# Patient Record
Sex: Male | Born: 1970 | Race: White | Hispanic: No | Marital: Single | Smoking: Never smoker
Health system: Southern US, Community
[De-identification: ages and names within clinical notes are randomized; demographics above are authoritative.]

## PROBLEM LIST (undated history)

## (undated) DIAGNOSIS — I10 Essential (primary) hypertension: Secondary | ICD-10-CM

## (undated) HISTORY — PX: RETINAL DETACHMENT SURGERY: SHX105

## (undated) HISTORY — PX: EYE SURGERY: SHX253

---

## 2014-04-18 ENCOUNTER — Ambulatory Visit: Payer: Self-pay | Admitting: Ophthalmology

## 2014-05-27 NOTE — Op Note (Signed)
PATIENT NAME:  Isaac White, Isaac White MR#:  [id number] DATE OF BIRTH:  10/16/1970  DATE OF PROCEDURE:  04/18/2014  PREPROCEDURE DIAGNOSIS: Retinal detachment, right eye.  POSTPROCEDURE DIAGNOSIS: Retinal detachment, right eye.  PROCEDURE: A 25-gauge pars plana vitrectomy with drain retinotomy, air fluid exchange, endolaser and 24% SF6.   ANESTHESIA: Monitored anesthesia care with retrobulbar block.   COMPLICATIONS: None.   BLOOD LOSS: Minimal.   DESCRIPTION OF PROCEDURE: The patient was previously seen in the clinic for a retinal tear and a PVD in his right eye and laser barricade was performed. He presented to the clinic today before the operating room with a macula on retinal detachment starting adjacent to the previous laser. There was a small tear noted and the previous area of laser appeared to be flat. On the day of surgery, the patient was examined personally by me prior to taking back to the operating room holding area and I agreed with the previous evaluation. Risks, benefits, alternatives, and complications were reviewed with the patient. He had prior vitrectomy with scleral buckle in the other eye in the past. The right eye was then marked, consents were reviewed and the patient was taken to the operating room in supine position. Monitored anesthesia care was administered and a retrobulbar block was performed. The eye was then prepped and draped in the usual sterile fashion. Three 25-gauge trocars were placed in the usual positions. The infusion trocar was checked to make sure it was in the vitreous cavity prior to starting the infusion. A core vitrectomy was performed. The patient did indeed have a posterior vitreous detachment and so after the core vitrectomy was performed the vitreous was trimmed for 360 degrees to the periphery. Attention was drawn superiorly to the area of the retinal detachment and the vitreous was trimmed very short in this area. There was a very small break noted in close  proximity to the area of the previous laser. The vitreous was trimmed over both of these areas very closely. A 360 degree scleral depressed exam showed no additional tears noted. The break was marked. A drain retinotomy was made to drain the subretinal fluid. Fluid-fluid exchange was performed followed by the air-fluid exchange. Endolaser was then used to barricade the drain at the retinal tear. Prior to surgery, I discussed with the patient doing 360 degree laser which he was in favor of given his history of retinal detachment in the other eye and so 360 degree barricade laser was performed peripherally at the vitreous base. Four times the vitreous volume of 24% SF6 was infused into the eye. The trocars were then removed. The superior trocars were sutured to ensure that they were airtight. The pressure was acceptable by palpation. The subconjunctival cefuroxime and dexamethasone was placed and the patient was patched and shielded to the recovery area in stable condition.    ____________________________ Laban Emperor Oval Linsey, MD jdr:sb D: 04/19/2014 20:30:34 ET T: 04/20/2014 10:33:15 ET JOB#: [id number]  cc: Janett Billow D. Oval Linsey, MD, <Dictator> Alexia Freestone MD ELECTRONICALLY SIGNED 04/21/2014 11:39

## 2017-04-15 ENCOUNTER — Ambulatory Visit (INDEPENDENT_AMBULATORY_CARE_PROVIDER_SITE_OTHER): Payer: 59 | Admitting: Family Medicine

## 2017-04-15 ENCOUNTER — Encounter: Payer: Self-pay | Admitting: Family Medicine

## 2017-04-15 VITALS — BP 120/80 | HR 80 | Temp 98.7°F | Resp 16 | Ht 70.0 in | Wt 191.0 lb

## 2017-04-15 DIAGNOSIS — Z9229 Personal history of other drug therapy: Secondary | ICD-10-CM

## 2017-04-15 DIAGNOSIS — R6882 Decreased libido: Secondary | ICD-10-CM | POA: Diagnosis not present

## 2017-04-15 DIAGNOSIS — Z23 Encounter for immunization: Secondary | ICD-10-CM

## 2017-04-15 DIAGNOSIS — Z0000 Encounter for general adult medical examination without abnormal findings: Secondary | ICD-10-CM

## 2017-04-15 NOTE — Progress Notes (Addendum)
Patient: Isaac White, Male    DOB: [birth year], 47 y.o.   MRN: [id number] Visit Date: 04/15/2017  Today's Provider: Lelon Huh, MD   Chief Complaint  Patient presents with  . Annual Exam   Subjective:  He presents today to re-establish and for complete physical. He was last seen in this office 02/20/2013. He has had no significant health events since that time. Reviewed patient's entire medical, social, and family history today.    Annual physical exam [name] is a 47 y.o. male who presents today for health maintenance and complete physical. He feels well. He reports exercising yes-cardio and weights. He reports he is sleeping well. Exercises 4-5 days a week with trainer. Has been more fatigued and reports lowered libido for several months. No trouble with mood. 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   ----------------------------------------------------------------   Review of Systems  Constitutional: Positive for fatigue. Negative for chills, diaphoresis and fever.  HENT: Negative for congestion, ear discharge, ear pain, hearing loss, nosebleeds, sore throat and tinnitus.   Eyes: Negative for photophobia, pain, discharge and redness.  Respiratory: Negative for cough, shortness of breath, wheezing and stridor.   Cardiovascular: Negative for chest pain, palpitations and leg swelling.  Gastrointestinal: Negative for abdominal pain, blood in stool, constipation, diarrhea, nausea and vomiting.  Endocrine: Negative for polydipsia.  Genitourinary: Negative for dysuria, flank pain, frequency, hematuria and urgency.  Musculoskeletal: Negative for back pain, myalgias and neck pain.  Skin: Negative for rash.  Allergic/Immunologic: Negative for environmental allergies.  Neurological: Negative for dizziness, tremors, seizures, weakness and headaches.  Hematological: Does not bruise/bleed easily.  Psychiatric/Behavioral: Negative for hallucinations and suicidal ideas. The  patient is not nervous/anxious.     Social History      He  reports that he has never smoked. He has never used smokeless tobacco. He reports that he drinks alcohol. He reports that he does not use drugs.       Social History   Socioeconomic History  . Marital status: Single    Spouse name: Not on file  . Number of children: Not on file  . Years of education: Not on file  . Highest education level: Not on file  Occupational History  . Not on file  Social Needs  . Financial resource strain: Not on file  . Food insecurity:    Worry: Not on file    Inability: Not on file  . Transportation needs:    Medical: Not on file    Non-medical: Not on file  Tobacco Use  . Smoking status: Never Smoker  . Smokeless tobacco: Never Used  Substance and Sexual Activity  . Alcohol use: Yes  . Drug use: Never  . Sexual activity: Not on file  Lifestyle  . Physical activity:    Days per week: Not on file    Minutes per session: Not on file  . Stress: Not on file  Relationships  . Social connections:    Talks on phone: Not on file    Gets together: Not on file    Attends religious service: Not on file    Active member of club or organization: Not on file    Attends meetings of clubs or organizations: Not on file    Relationship status: Not on file  Other Topics Concern  . Not on file  Social History Narrative  . Not on file    History reviewed. No pertinent past medical history.  There are no active problems to display for this patient.   History reviewed. No pertinent surgical history.  Family History        Family Status  Relation Name Status  . Mother  Alive  . Father Egbert Garibaldi [place]  . Sister  Alive  . Brother  Alive        His family history includes Atrial fibrillation in his father; COPD in his father; Heart failure in his father; Hyperlipidemia in his father; Hypertension in his father; Kidney disease in his father.        No Known Allergies  No  current outpatient medications on file.   Patient Care Team: Birdie Sons, MD as PCP - General (Family Medicine)      Objective:   Vitals: BP 120/80 (BP Location: Right Arm, Patient Position: Sitting, Cuff Size: Large)   Pulse 80   Temp 98.7 F (37.1 C) (Oral)   Resp 16   Ht 5\' 10"  (1.778 m)   Wt 191 lb (86.6 kg)   SpO2 98%   BMI 27.41 kg/m    Vitals:   04/15/17 1013  BP: 120/80  Pulse: 80  Resp: 16  Temp: 98.7 F (37.1 C)  TempSrc: Oral  SpO2: 98%  Weight: 191 lb (86.6 kg)     Physical Exam   General Appearance:    Alert, cooperative, no distress, appears stated age  Head:    Normocephalic, without obvious abnormality, atraumatic  Eyes:    PERRL, conjunctiva/corneas clear, EOM's intact, fundi    benign, both eyes       Ears:    Normal TM's and external ear canals, both ears  Nose:   Nares normal, septum midline, mucosa normal, no drainage   or sinus tenderness  Throat:   Lips, mucosa, and tongue normal; teeth and gums normal  Neck:   Supple, symmetrical, trachea midline, no adenopathy;       thyroid:  No enlargement/tenderness/nodules; no carotid   bruit or JVD  Back:     Symmetric, no curvature, ROM normal, no CVA tenderness  Lungs:     Clear to auscultation bilaterally, respirations unlabored  Chest wall:    No tenderness or deformity  Heart:    Regular rate and rhythm, S1 and S2 normal, no murmur, rub   or gallop  Abdomen:     Soft, non-tender, bowel sounds active all four quadrants,    no masses, no organomegaly  Genitalia:    deferred  Rectal:    deferred  Extremities:   Extremities normal, atraumatic, no cyanosis or edema  Pulses:   2+ and symmetric all extremities  Skin:   Skin color, texture, turgor normal, no rashes or lesions  Lymph nodes:   Cervical, supraclavicular, and axillary nodes normal  Neurologic:   CNII-XII intact. Normal strength, sensation and reflexes      throughout    Depression Screen PHQ 2/9 Scores 04/15/2017  PHQ - 2  Score 0  PHQ- 9 Score 0      Assessment & Plan:     Routine Health Maintenance and Physical Exam  Exercise Activities and Dietary recommendations Goals    None       There is no immunization history on file for this patient.  Health Maintenance  Topic Date Due  . HIV Screening  06/30/1985  . TETANUS/TDAP  06/30/1989  . INFLUENZA VACCINE  08/26/2016     Discussed health benefits of physical activity, and encouraged him to engage in  regular exercise appropriate for his age and condition.    --------------------------------------------------------------------  1. Annual physical exam  - Comprehensive metabolic panel - HIV antibody - Lipid panel - CBC - Testosterone,Free and Total - PSA - DHEA - TSH - VITAMIN D 25 Hydroxy (Vit-D Deficiency, Fractures) - Cortisol  2. Decreased libido  - Comprehensive metabolic panel - HIV antibody - Lipid panel - CBC - Testosterone,Free and Total - PSA - DHEA - TSH - VITAMIN D 25 Hydroxy (Vit-D Deficiency, Fractures) - Cortisol  3. Need for prophylactic vaccination using tetanus and diphtheria toxoids adsorbed (Td) vaccine  - Td : Tetanus/diphtheria >7yo Preservative  free  4. Hepatitis A and hepatitis B vaccine administered  - Hepatitis A vaccine adult IM - Hepatitis B vaccine adult IM  Return in about 1 month (around 05/16/2017) for Hep B #2.     Lelon Huh, MD  [facility]

## 2017-04-16 ENCOUNTER — Encounter: Payer: Self-pay | Admitting: Family Medicine

## 2017-04-16 DIAGNOSIS — Z0000 Encounter for general adult medical examination without abnormal findings: Secondary | ICD-10-CM | POA: Diagnosis not present

## 2017-04-19 ENCOUNTER — Telehealth: Payer: Self-pay | Admitting: *Deleted

## 2017-04-19 ENCOUNTER — Encounter: Payer: Self-pay | Admitting: Family Medicine

## 2017-04-19 DIAGNOSIS — E291 Testicular hypofunction: Secondary | ICD-10-CM

## 2017-04-19 NOTE — Telephone Encounter (Signed)
Returning call.

## 2017-04-19 NOTE — Telephone Encounter (Signed)
Patient was notified of results. Expressed understanding.  

## 2017-04-19 NOTE — Telephone Encounter (Signed)
-----   Message from Birdie Sons, MD sent at 04/18/2017  7:10 PM EDT ----- All labs including PSA, blood sugar, kidney functions, electrolytes and cholesterol are all normal. Check labs yearly.

## 2017-04-19 NOTE — Telephone Encounter (Signed)
LMOVM for pt to return call 

## 2017-04-20 DIAGNOSIS — E291 Testicular hypofunction: Secondary | ICD-10-CM | POA: Diagnosis not present

## 2017-04-20 LAB — CBC
Hematocrit: 40.8 % (ref 37.5–51.0)
Hemoglobin: 13.8 g/dL (ref 13.0–17.7)
MCH: 31.4 pg (ref 26.6–33.0)
MCHC: 33.8 g/dL (ref 31.5–35.7)
MCV: 93 fL (ref 79–97)
Platelets: 159 10*3/uL (ref 150–379)
RBC: 4.4 x10E6/uL (ref 4.14–5.80)
RDW: 13 % (ref 12.3–15.4)
WBC: 5.2 10*3/uL (ref 3.4–10.8)

## 2017-04-20 LAB — COMPREHENSIVE METABOLIC PANEL
ALT: 26 [IU]/L (ref 0–44)
AST: 23 [IU]/L (ref 0–40)
Albumin/Globulin Ratio: 2.4 — ABNORMAL HIGH (ref 1.2–2.2)
Albumin: 4.8 g/dL (ref 3.5–5.5)
Alkaline Phosphatase: 61 [IU]/L (ref 39–117)
BUN/Creatinine Ratio: 19 (ref 9–20)
BUN: 22 mg/dL (ref 6–24)
Bilirubin Total: 0.4 mg/dL (ref 0.0–1.2)
CO2: 24 mmol/L (ref 20–29)
Calcium: 9.5 mg/dL (ref 8.7–10.2)
Chloride: 102 mmol/L (ref 96–106)
Creatinine, Ser: 1.18 mg/dL (ref 0.76–1.27)
GFR calc Af Amer: 85 mL/min/{1.73_m2} (ref >59)
GFR calc non Af Amer: 74 mL/min/{1.73_m2} (ref >59)
Globulin, Total: 2 g/dL (ref 1.5–4.5)
Glucose: 88 mg/dL (ref 65–99)
Potassium: 4.6 mmol/L (ref 3.5–5.2)
Sodium: 142 mmol/L (ref 134–144)
Total Protein: 6.8 g/dL (ref 6.0–8.5)

## 2017-04-20 LAB — CORTISOL: Cortisol: 11.8 ug/dL

## 2017-04-20 LAB — VITAMIN D 25 HYDROXY (VIT D DEFICIENCY, FRACTURES): Vit D, 25-Hydroxy: 33.2 ng/mL (ref 30.0–100.0)

## 2017-04-20 LAB — TESTOSTERONE,FREE AND TOTAL
Testosterone, Free: 7.9 pg/mL (ref 6.8–21.5)
Testosterone: 268 ng/dL (ref 264–916)

## 2017-04-20 LAB — HIV ANTIBODY (ROUTINE TESTING W REFLEX): HIV Screen 4th Generation wRfx: NONREACTIVE

## 2017-04-20 LAB — LIPID PANEL
Chol/HDL Ratio: 3.3 {ratio} (ref 0.0–5.0)
Cholesterol, Total: 185 mg/dL (ref 100–199)
HDL: 56 mg/dL (ref >39)
LDL Calculated: 118 mg/dL — ABNORMAL HIGH (ref 0–99)
Triglycerides: 56 mg/dL (ref 0–149)
VLDL Cholesterol Cal: 11 mg/dL (ref 5–40)

## 2017-04-20 LAB — PSA: Prostate Specific Ag, Serum: 0.7 ng/mL (ref 0.0–4.0)

## 2017-04-20 LAB — DHEA: Dehydroepiandrosterone: 229 ng/dL (ref 31–701)

## 2017-04-20 LAB — TSH: TSH: 4.06 u[IU]/mL (ref 0.450–4.500)

## 2017-04-21 LAB — TESTOSTERONE,FREE AND TOTAL
Testosterone, Free: 6.1 pg/mL — ABNORMAL LOW (ref 6.8–21.5)
Testosterone: 314 ng/dL (ref 264–916)

## 2017-04-21 LAB — PROLACTIN: Prolactin: 3.3 ng/mL — ABNORMAL LOW (ref 4.0–15.2)

## 2017-04-21 LAB — FOLLICLE STIMULATING HORMONE: FSH: 5.5 m[IU]/mL (ref 1.5–12.4)

## 2017-04-22 ENCOUNTER — Telehealth: Payer: Self-pay | Admitting: Family Medicine

## 2017-04-22 MED ORDER — TESTOSTERONE 20.25 MG/ACT (1.62%) TD GEL: 2.0000 | Freq: Every day | TRANSDERMAL | 1 refills | Status: DC

## 2017-04-22 NOTE — Telephone Encounter (Signed)
This patient wanted to start testosterone,but I don't have any pharmacy information. I can't tell if he got my message asking which pharmacy he wants to use. Can you please call and find out. Thanks.

## 2017-04-23 NOTE — Telephone Encounter (Signed)
Patient uses [facility].

## 2017-04-27 ENCOUNTER — Other Ambulatory Visit: Payer: Self-pay | Admitting: Family Medicine

## 2017-04-28 MED ORDER — TESTOSTERONE CYPIONATE 100 MG/ML IM SOLN: 100.0000 mg | INTRAMUSCULAR | 0 refills | Status: DC

## 2017-04-28 NOTE — Telephone Encounter (Signed)
Pt called back regarding his Rx testocerone gel.  He said he sent a message in [place] with several options for insurance to cover.  He has not heard anything back  His call back is [phone].  Thanks Con Memos

## 2017-04-28 NOTE — Telephone Encounter (Signed)
Please advise patient have sent prescription for testosterone injection to [facility]. He needs to bring medications to office for instructions on doing self injections. He needs to scheduled follow up o.v. In 8 weeks.   Also, please check with pharmacy to see if they have needles and syringes for IM injection. Should only need 1 ml syringes, can call in prescription for 25 of each.

## 2017-04-29 NOTE — Telephone Encounter (Signed)
Patient was advised. Per pharmacy they do have syringes and needles. Verbal rx given over the phone for 25 of each.

## 2017-05-17 ENCOUNTER — Ambulatory Visit (INDEPENDENT_AMBULATORY_CARE_PROVIDER_SITE_OTHER): Payer: 59 | Admitting: Family Medicine

## 2017-05-17 VITALS — Temp 98.3°F

## 2017-05-17 DIAGNOSIS — Z23 Encounter for immunization: Secondary | ICD-10-CM

## 2017-05-17 NOTE — Progress Notes (Signed)
After obtaining informed consent, the second Hepatitis B immunization is given by Elizabeth Palau, CMA(AAMA).  1. Need for hepatitis A and B vaccination Should return for Hep B #3 and Hep A #2 around 10-15-17. - Hepatitis B vaccine pediatric / adolescent 3-dose IM

## 2017-07-05 ENCOUNTER — Encounter: Payer: Self-pay | Admitting: Family Medicine

## 2017-07-05 ENCOUNTER — Ambulatory Visit: Payer: 59 | Admitting: Family Medicine

## 2017-07-05 VITALS — BP 128/80 | HR 70 | Temp 98.6°F | Resp 16 | Wt 190.0 lb

## 2017-07-05 DIAGNOSIS — E291 Testicular hypofunction: Secondary | ICD-10-CM | POA: Diagnosis not present

## 2017-07-05 NOTE — Progress Notes (Signed)
       Patient: Isaac White Male    DOB: 09-19-1970   47 y.o.   MRN: [id number] Visit Date: 07/05/2017  Today's Provider: Lelon Huh, MD   Chief Complaint  Patient presents with  . Follow-up   Subjective:    HPI  Low testosterone  From 04/15/2017-labs checked.  Lab Results  Component Value Date   TESTOSTERONE 314 04/20/2017   Since last ov on 04/29/2017 started testosterone injections at 40ml x 100mg /ml every two weeks. . Patient reports good compliance with treatment and good tolerance. He has noticed a little bit of improvement in energy level, exercise stamina, and libido, but not as much as he expected. His mood has not changed. Energy level is consistently between injections.    Wt Readings from Last 3 Encounters:  07/05/17 190 lb (86.2 kg)  04/15/17 191 lb (86.6 kg)       No Known Allergies   Current Outpatient Medications:  .  testosterone cypionate (DEPOTESTOTERONE CYPIONATE) 100 MG/ML injection, Inject 1 mL (100 mg total) into the muscle every 14 (fourteen) days. For IM use only, Disp: 10 mL, Rfl: 0  Review of Systems  Constitutional: Negative for appetite change, chills and fever.  Respiratory: Negative for chest tightness, shortness of breath and wheezing.   Cardiovascular: Negative for chest pain and palpitations.  Gastrointestinal: Negative for abdominal pain, nausea and vomiting.    Social History   Tobacco Use  . Smoking status: Never Smoker  . Smokeless tobacco: Never Used  Substance Use Topics  . Alcohol use: Yes    Comment: occasionally   Objective:   BP 128/80 (BP Location: Left Arm, Patient Position: Sitting, Cuff Size: Large)   Pulse 70   Temp 98.6 F (37 C) (Oral)   Resp 16   Wt 190 lb (86.2 kg)   SpO2 98% Comment: room air  BMI 27.26 kg/m  Vitals:   07/05/17 1345  BP: 128/80  Pulse: 70  Resp: 16  Temp: 98.6 F (37 C)  TempSrc: Oral  SpO2: 98%  Weight: 190 lb (86.2 kg)     Physical Exam  General appearance: alert,  well developed, well nourished, cooperative and in no distress Head: Normocephalic, without obvious abnormality, atraumatic Respiratory: Respirations even and unlabored, normal respiratory rate Extremities: No gross deformities .     Assessment & Plan:     1. Hypogonadism male Doing well since initiation of testosterone injection two weeks ago.  - Testosterone,Free and Total       Lelon Huh, MD  [facility]

## 2017-07-06 DIAGNOSIS — E291 Testicular hypofunction: Secondary | ICD-10-CM | POA: Diagnosis not present

## 2017-07-07 ENCOUNTER — Other Ambulatory Visit: Payer: Self-pay | Admitting: Family Medicine

## 2017-07-07 ENCOUNTER — Telehealth: Payer: Self-pay | Admitting: *Deleted

## 2017-07-07 LAB — TESTOSTERONE,FREE AND TOTAL
Testosterone, Free: 4.3 pg/mL — ABNORMAL LOW (ref 6.8–21.5)
Testosterone: 108 ng/dL — ABNORMAL LOW (ref 264–916)

## 2017-07-07 MED ORDER — TESTOSTERONE CYPIONATE 100 MG/ML IM SOLN: 200.0000 mg | INTRAMUSCULAR | 0 refills | Status: DC

## 2017-07-07 NOTE — Telephone Encounter (Signed)
LMOVM for pt to return call 

## 2017-07-07 NOTE — Telephone Encounter (Signed)
-----   Message from Birdie Sons, MD sent at 07/07/2017  7:52 AM EDT ----- Testosterone levels still low, need to increase to 2 ml every two weeks. Have sent new prescription to pharmacy. Need to recheck levels in 1 month.

## 2017-07-07 NOTE — Telephone Encounter (Signed)
Patient advised. He will call back in 1 month to request a lab slip to have testosterone rechecked.

## 2017-08-13 ENCOUNTER — Telehealth: Payer: Self-pay | Admitting: Family Medicine

## 2017-08-13 DIAGNOSIS — E291 Testicular hypofunction: Secondary | ICD-10-CM

## 2017-08-13 NOTE — Telephone Encounter (Signed)
Please check with patient and see how he is doing with increased dose of testosterone, if tolerating then need to get levels rechecked sometimes in the next week or so. Can print pended order and leave at lab for him. Needs to be drawn as close to 8am as possible.

## 2017-08-13 NOTE — Telephone Encounter (Signed)
LMOVM for pt to return call 

## 2017-08-13 NOTE — Telephone Encounter (Signed)
-----   Message from Birdie Sons, MD sent at 07/07/2017  7:55 AM EDT ----- Regarding: FW: check testosterone levels first week of July   ----- Message ----- From: Birdie Sons, MD Sent: 07/07/2017   7:54 AM To: Birdie Sons, MD Subject: check testosterone levels first week of July

## 2017-08-16 NOTE — Telephone Encounter (Signed)
Patient states he is tolerating increased dose well. He will come in one morning this week to get labs done. Lab slip printed.

## 2017-08-18 LAB — CBC
Hematocrit: 40.5 % (ref 37.5–51.0)
Hemoglobin: 13.7 g/dL (ref 13.0–17.7)
MCH: 30.8 pg (ref 26.6–33.0)
MCHC: 33.8 g/dL (ref 31.5–35.7)
MCV: 91 fL (ref 79–97)
Platelets: 174 10*3/uL (ref 150–450)
RBC: 4.45 x10E6/uL (ref 4.14–5.80)
RDW: 13.4 % (ref 12.3–15.4)
WBC: 3.9 10*3/uL (ref 3.4–10.8)

## 2017-08-18 LAB — TESTOSTERONE,FREE AND TOTAL
Testosterone, Free: 5.6 pg/mL — ABNORMAL LOW (ref 6.8–21.5)
Testosterone: 171 ng/dL — ABNORMAL LOW (ref 264–916)

## 2017-10-06 ENCOUNTER — Other Ambulatory Visit: Payer: Self-pay | Admitting: Family Medicine

## 2017-10-06 MED ORDER — TESTOSTERONE CYPIONATE 100 MG/ML IM SOLN: 200.0000 mg | INTRAMUSCULAR | 2 refills | Status: DC

## 2017-10-08 ENCOUNTER — Telehealth: Payer: Self-pay | Admitting: Family Medicine

## 2017-10-08 NOTE — Telephone Encounter (Signed)
Advised patient as below.  

## 2017-10-08 NOTE — Telephone Encounter (Signed)
Pt called saying he called [facility] and they have not received an order for his Testosterone 100 mg.  Pt's CB#  [phone]  Thanks  teri

## 2017-10-25 ENCOUNTER — Encounter: Payer: Self-pay | Admitting: Family Medicine

## 2017-10-25 ENCOUNTER — Ambulatory Visit: Payer: 59 | Admitting: Family Medicine

## 2017-10-25 VITALS — BP 143/86 | HR 70 | Temp 98.9°F | Resp 16 | Wt 196.0 lb

## 2017-10-25 DIAGNOSIS — Z23 Encounter for immunization: Secondary | ICD-10-CM | POA: Diagnosis not present

## 2017-10-25 DIAGNOSIS — D1800 Hemangioma unspecified site: Secondary | ICD-10-CM | POA: Diagnosis not present

## 2017-10-25 DIAGNOSIS — E291 Testicular hypofunction: Secondary | ICD-10-CM | POA: Diagnosis not present

## 2017-10-25 DIAGNOSIS — R221 Localized swelling, mass and lump, neck: Secondary | ICD-10-CM

## 2017-10-25 NOTE — Progress Notes (Signed)
       Patient: Isaac White Male    DOB: Nov 10, 1970   47 y.o.   MRN: [id number] Visit Date: 10/25/2017  Today's Provider: Lelon Huh, MD   Chief Complaint  Patient presents with  . Mass   Subjective:    HPI Neck mass: Patient comes in for an evaluation of a small mass on the right side of this neck that appeared 2 months ago. Patient denies any pain or change in size.   Skin lesion: Patient comes in for an evaluation of a skin lesion on the bottom of his left foot. Patient states the lesion appeared 2 months ago and hans'nt changed in size.   Is also here to follow up on hypogonadism. Testosterone dose was double in July Lab Results  Component Value Date   TESTOSTERONE 171 (L) 08/17/2017   He states he has had considerable improvement in energy levels, less aching in his muscles and improvement in general well being.      No Known Allergies   Current Outpatient Medications:  .  testosterone cypionate (DEPOTESTOTERONE CYPIONATE) 100 MG/ML injection, Inject 2 mLs (200 mg total) into the muscle every 14 (fourteen) days. For IM use only, Disp: 10 mL, Rfl: 2  Review of Systems  Constitutional: Negative for appetite change, chills and fever.  Respiratory: Negative for chest tightness, shortness of breath and wheezing.   Cardiovascular: Negative for chest pain and palpitations.  Gastrointestinal: Negative for abdominal pain, nausea and vomiting.  Skin:       Skin lesion and neck mass    Social History   Tobacco Use  . Smoking status: Never Smoker  . Smokeless tobacco: Never Used  Substance Use Topics  . Alcohol use: Yes    Comment: occasionally   Objective:   BP (!) 143/86 (BP Location: Left Arm, Cuff Size: Large)   Pulse 70   Temp 98.9 F (37.2 C) (Oral)   Resp 16   Wt 196 lb (88.9 kg)   SpO2 98% Comment: room air  BMI 28.12 kg/m  Vitals:   10/25/17 1040 10/25/17 1043  BP: (!) 142/84 (!) 143/86  Pulse: 70   Resp: 16   Temp: 98.9 F (37.2 C)     TempSrc: Oral   SpO2: 98%   Weight: 196 lb (88.9 kg)      Physical Exam  General appearance: alert, well developed, well nourished, cooperative and in no distress Head: Normocephalic, without obvious abnormality, atraumatic Neck : subtle small, pea sized mass, non tender right anterolateral neck.  Ext: about 63mm well circumscribed reddish, non tender, raised lesion dorsum of left foot consistent with hemangioma.      Assessment & Plan:     1. Mass of right side of neck  - US Soft Tissue Head/Neck; Future  2. Hypogonadism male Subjectively improved on supplemental testosterone. Check levels today, he would like to change formulation to 2mg /ml with next refill.  - Testosterone,Free and Total - CBC  3. Need for hepatitis B vaccination  - Hepatitis B vaccine adult IM  4. Need for hepatitis A vaccination  - Hepatitis A vaccine adult IM  5. Need for influenza vaccination  - Flu Vaccine QUAD 6+ mos PF IM (Fluarix Quad PF)  6. Hemangioma (suspected) Offered podiatry referral. He is going to given lesion some more time to see if it will resolve      Lelon Huh, MD  [place]

## 2017-10-26 DIAGNOSIS — E291 Testicular hypofunction: Secondary | ICD-10-CM | POA: Diagnosis not present

## 2017-10-28 ENCOUNTER — Other Ambulatory Visit: Payer: Self-pay | Admitting: Family Medicine

## 2017-10-28 LAB — CBC
Hematocrit: 46.8 % (ref 37.5–51.0)
Hemoglobin: 15.3 g/dL (ref 13.0–17.7)
MCH: 30.5 pg (ref 26.6–33.0)
MCHC: 32.7 g/dL (ref 31.5–35.7)
MCV: 93 fL (ref 79–97)
Platelets: 166 10*3/uL (ref 150–450)
RBC: 5.01 x10E6/uL (ref 4.14–5.80)
RDW: 13.2 % (ref 12.3–15.4)
WBC: 5.6 10*3/uL (ref 3.4–10.8)

## 2017-10-28 LAB — TESTOSTERONE,FREE AND TOTAL
Testosterone, Free: 27.9 pg/mL — ABNORMAL HIGH (ref 6.8–21.5)
Testosterone: 1293 ng/dL — ABNORMAL HIGH (ref 264–916)

## 2017-10-28 MED ORDER — TESTOSTERONE CYPIONATE 200 MG/ML IJ SOLN: 1.0000 mL | INTRAMUSCULAR | 5 refills | Status: DC

## 2017-11-01 ENCOUNTER — Ambulatory Visit
Admission: RE | Admit: 2017-11-01 | Discharge: 2017-11-01 | Disposition: A | Discharge status: Home / Self Care | Payer: 59 | Source: Ambulatory Visit | Attending: Family Medicine | Admitting: Family Medicine

## 2017-11-01 DIAGNOSIS — R599 Enlarged lymph nodes, unspecified: Secondary | ICD-10-CM | POA: Diagnosis not present

## 2017-11-01 DIAGNOSIS — R221 Localized swelling, mass and lump, neck: Secondary | ICD-10-CM

## 2018-01-20 ENCOUNTER — Telehealth: Payer: Self-pay | Admitting: Family Medicine

## 2018-01-20 DIAGNOSIS — E291 Testicular hypofunction: Secondary | ICD-10-CM

## 2018-01-20 NOTE — Telephone Encounter (Signed)
-----   Message from Birdie Sons, MD sent at 01/18/2018  9:57 AM EST ----- Regarding: FW: recheck testosterone before end of year.   ----- Message ----- From: Birdie Sons, MD Sent: 01/13/2018 To: Birdie Sons, MD Subject: recheck testosterone before end of year.

## 2018-01-20 NOTE — Telephone Encounter (Signed)
Please advise patient is time to recheck testosterone levels since adjusting dose in the fall. Please print and leave order at lab for him. Thanks.

## 2018-01-20 NOTE — Telephone Encounter (Signed)
Tried calling; pt's mailbox is full.   Thanks,   -Laura  

## 2018-01-24 NOTE — Telephone Encounter (Signed)
Patient advised as below.  

## 2018-01-27 DIAGNOSIS — E291 Testicular hypofunction: Secondary | ICD-10-CM | POA: Diagnosis not present

## 2018-01-29 LAB — CBC
Hematocrit: 43.5 % (ref 37.5–51.0)
Hemoglobin: 14.9 g/dL (ref 13.0–17.7)
MCH: 30.9 pg (ref 26.6–33.0)
MCHC: 34.3 g/dL (ref 31.5–35.7)
MCV: 90 fL (ref 79–97)
Platelets: 189 10*3/uL (ref 150–450)
RBC: 4.82 x10E6/uL (ref 4.14–5.80)
RDW: 13 % (ref 12.3–15.4)
WBC: 5.6 10*3/uL (ref 3.4–10.8)

## 2018-01-29 LAB — TESTOSTERONE,FREE AND TOTAL
Testosterone, Free: 8.4 pg/mL (ref 6.8–21.5)
Testosterone: 234 ng/dL — ABNORMAL LOW (ref 264–916)

## 2018-03-08 ENCOUNTER — Other Ambulatory Visit: Payer: Self-pay | Admitting: Family Medicine

## 2018-03-08 MED ORDER — TESTOSTERONE CYPIONATE 200 MG/ML IJ SOLN: 1.0000 mL | INTRAMUSCULAR | 5 refills | Status: DC

## 2018-03-08 NOTE — Telephone Encounter (Signed)
[facility] faxed refill request for the following medications:  Testoster CYP VIAL  Please advise. Thanks TNP

## 2018-03-09 ENCOUNTER — Encounter: Payer: Self-pay | Admitting: Family Medicine

## 2018-03-14 ENCOUNTER — Encounter: Payer: Self-pay | Admitting: Family Medicine

## 2018-03-14 ENCOUNTER — Telehealth: Payer: Self-pay | Admitting: Family Medicine

## 2018-03-14 NOTE — Telephone Encounter (Signed)
Prescription was sent to [facility] on 03/08/2018. Please call [facility] and find out what the problem is.   Thanks.   ----- Message -----  From: Marigene Ehlers  Sent: 03/14/2018  8:26 AM EST  To: [facility]  Subject: Non-Urgent Medical Question             Hi Dr Caryn Section,    I sent you an email last week about my prescription that I'm out of testosterone and need a refill. My pharmacy [facility] recently closed. I ordered the refill through [facility] and they called Friday saying that they reached out but haven't heard anything. Can you follow up to approve the refill?    Thank you,  Isaac White

## 2018-03-15 NOTE — Telephone Encounter (Signed)
Pt with a representative from [facility].  They just need the Pt to call and sign some paper work before they can ship out his first RX.  I advised the pt and gave him the number to call [phone].  Thanks,   -Mickel Baas

## 2018-08-09 ENCOUNTER — Encounter: Payer: Self-pay | Admitting: Family Medicine

## 2018-08-12 ENCOUNTER — Encounter: Payer: Self-pay | Admitting: Family Medicine

## 2018-08-12 MED ORDER — TESTOSTERONE CYPIONATE 200 MG/ML IJ SOLN: 1.0000 mL | INTRAMUSCULAR | 1 refills | Status: DC

## 2018-12-27 ENCOUNTER — Encounter: Payer: Self-pay | Admitting: Family Medicine

## 2018-12-27 DIAGNOSIS — E291 Testicular hypofunction: Secondary | ICD-10-CM | POA: Insufficient documentation

## 2018-12-28 ENCOUNTER — Other Ambulatory Visit: Payer: Self-pay | Admitting: Family Medicine

## 2018-12-29 NOTE — Telephone Encounter (Signed)
Please review. Dr. Caryn Section is out of the office until 01/02/2019.

## 2019-02-14 ENCOUNTER — Encounter: Payer: Self-pay | Admitting: Family Medicine

## 2019-03-07 ENCOUNTER — Other Ambulatory Visit: Payer: Self-pay | Admitting: Family Medicine

## 2019-04-07 ENCOUNTER — Ambulatory Visit: Payer: Self-pay | Attending: Internal Medicine

## 2019-04-07 DIAGNOSIS — Z23 Encounter for immunization: Secondary | ICD-10-CM

## 2019-04-07 NOTE — Progress Notes (Signed)
   Covid-19 Vaccination Clinic  Name:  Isaac White    MRN: [id number] DOB: 1970/08/30  04/07/2019  [name] was observed post Covid-19 immunization for 15 minutes without incident. He was provided with Vaccine Information Sheet and instruction to access the V-Safe system.   [name] was instructed to call 911 with any severe reactions post vaccine: Marland Kitchen Difficulty breathing  . Swelling of face and throat  . A fast heartbeat  . A bad rash all over body  . Dizziness and weakness   Immunizations Administered    Name Date Dose VIS Date Route   Pfizer COVID-19 Vaccine 04/07/2019 10:36 AM 0.3 mL 01/06/2019 Intramuscular   Manufacturer: [place]   Lot: [id number]   [place]: [id number]

## 2019-05-01 ENCOUNTER — Ambulatory Visit: Payer: Self-pay | Attending: Internal Medicine

## 2019-05-01 DIAGNOSIS — Z23 Encounter for immunization: Secondary | ICD-10-CM

## 2019-05-01 NOTE — Progress Notes (Signed)
   Covid-19 Vaccination Clinic  Name:  Isaac White    MRN: [id number] DOB: Mar 28, 1970  05/01/2019  Mr. Silerio was observed post Covid-19 immunization for 15 minutes without incident. He was provided with Vaccine Information Sheet and instruction to access the V-Safe system.   Mr. Noriega was instructed to call 911 with any severe reactions post vaccine: Marland Kitchen Difficulty breathing  . Swelling of face and throat  . A fast heartbeat  . A bad rash all over body  . Dizziness and weakness   Immunizations Administered    Name Date Dose VIS Date Route   Pfizer COVID-19 Vaccine 05/01/2019  2:16 PM 0.3 mL 01/06/2019 Intramuscular   Manufacturer: [place]   Lot: [id number]   [place]: [id number]

## 2019-06-08 ENCOUNTER — Encounter: Payer: Self-pay | Admitting: Family Medicine

## 2019-06-08 MED ORDER — TESTOSTERONE CYPIONATE 200 MG/ML IM SOLN: INTRAMUSCULAR | 5 refills | Status: DC

## 2019-08-14 ENCOUNTER — Encounter: Payer: Self-pay | Admitting: Family Medicine

## 2019-08-15 MED ORDER — TESTOSTERONE CYPIONATE 200 MG/ML IM SOLN: INTRAMUSCULAR | 5 refills | Status: DC

## 2019-08-19 IMAGING — US US SOFT TISSUE HEAD/NECK
1 series · 8 of 8 positions shown · non-contrast
Comparison: None.

CLINICAL DATA: 47-year-old male with a history of palpable neck
lump

EXAM:
ULTRASOUND OF HEAD/NECK SOFT TISSUES
TECHNIQUE: Ultrasound examination of the head and neck soft tissues was
performed in the area of clinical concern.

[Series 1: us soft tissue head/neck · 0.07mm/px · 8 of 8 slices shown]
[im 1/8]
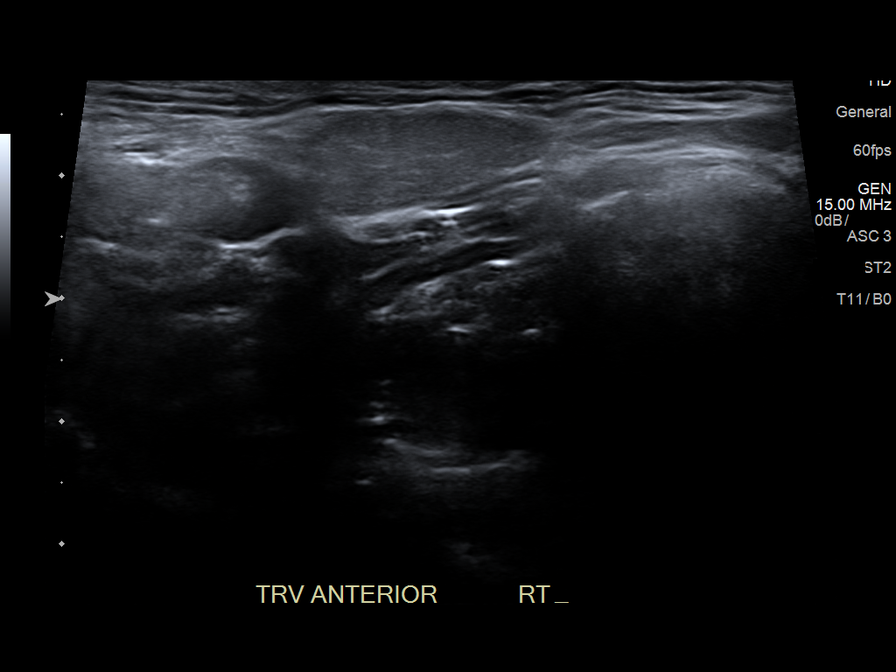
[im 2/8]
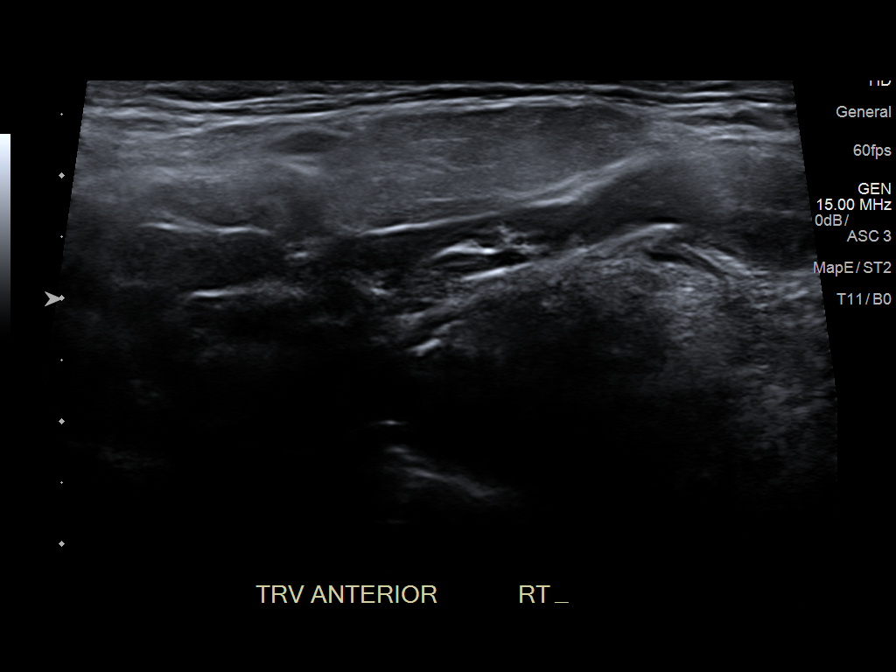
[im 3/8]
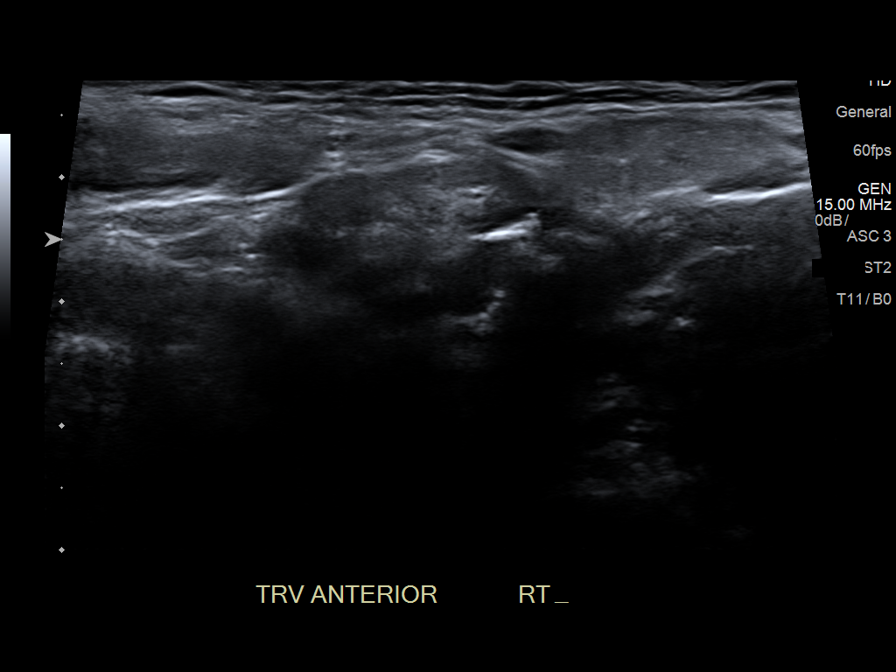
[im 4/8]
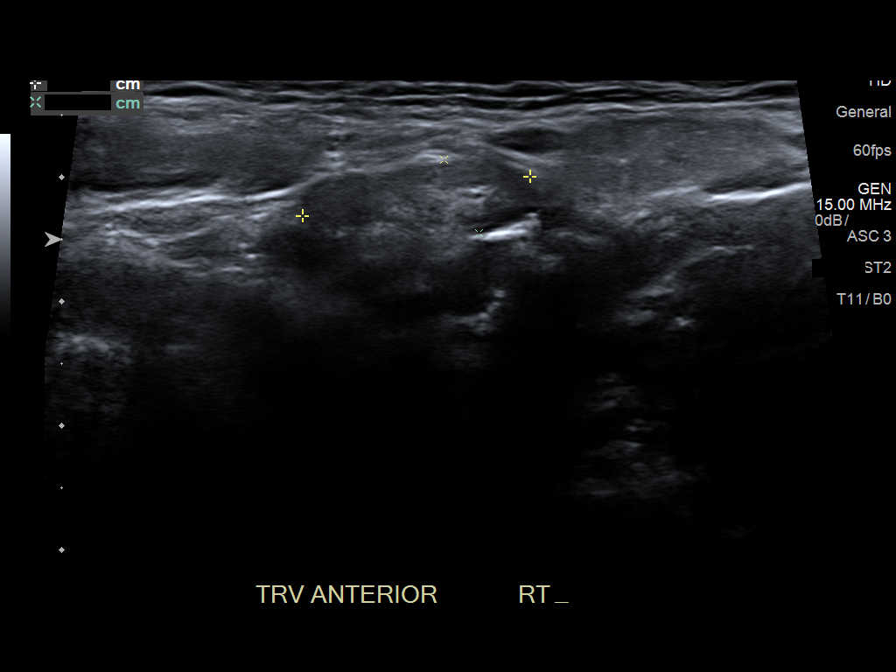
[im 5/8]
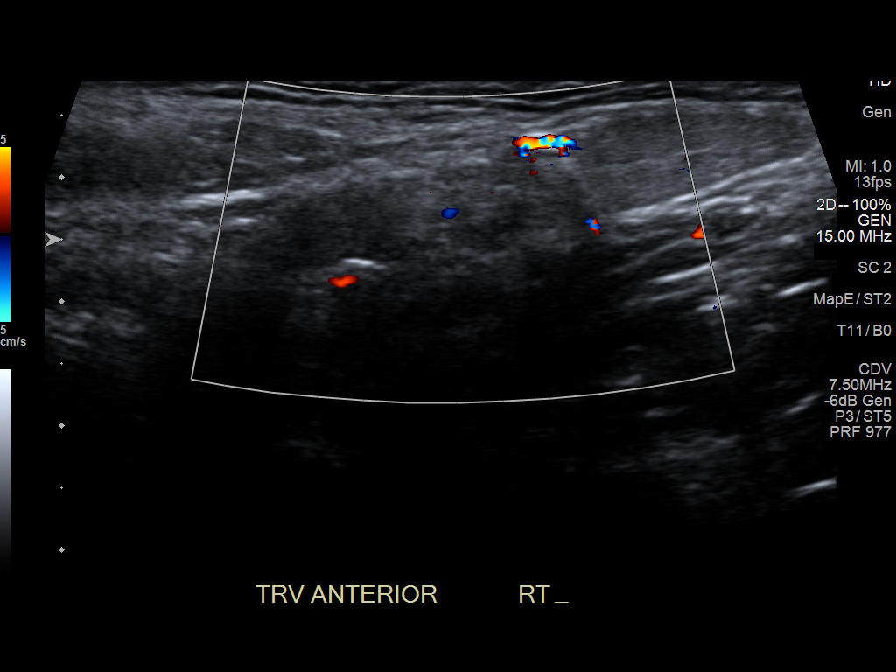
[im 6/8]
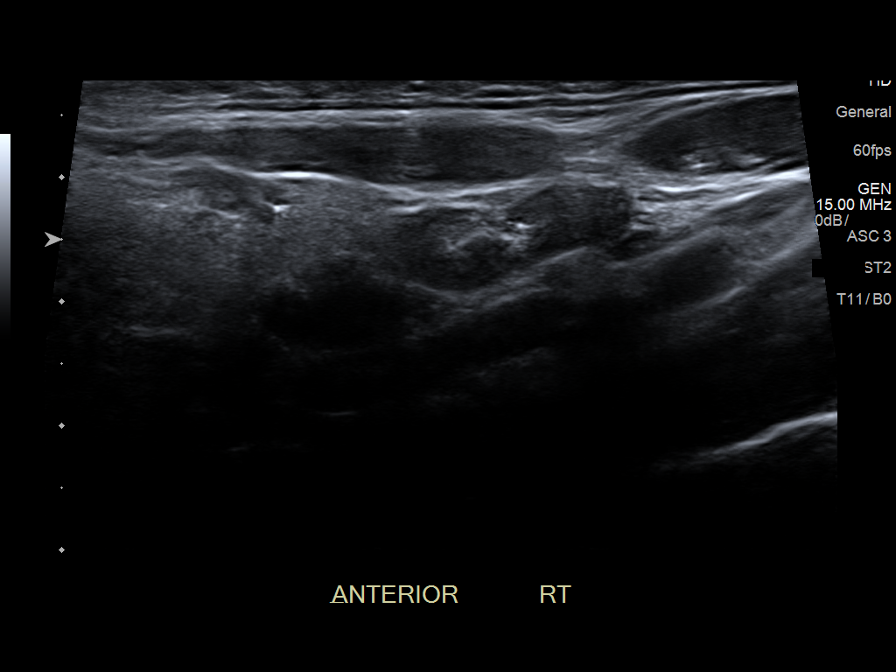
[im 7/8]
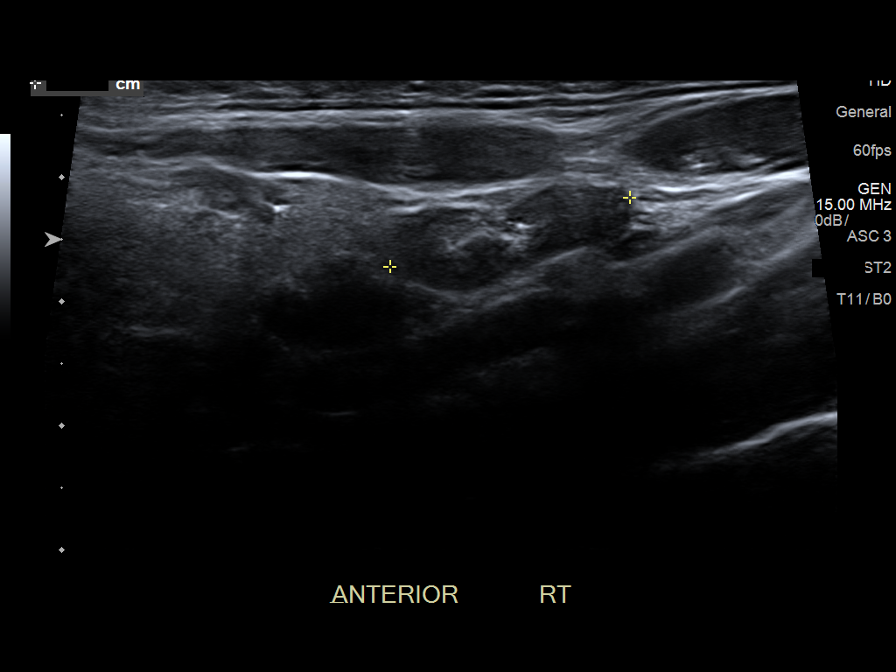
[im 8/8]
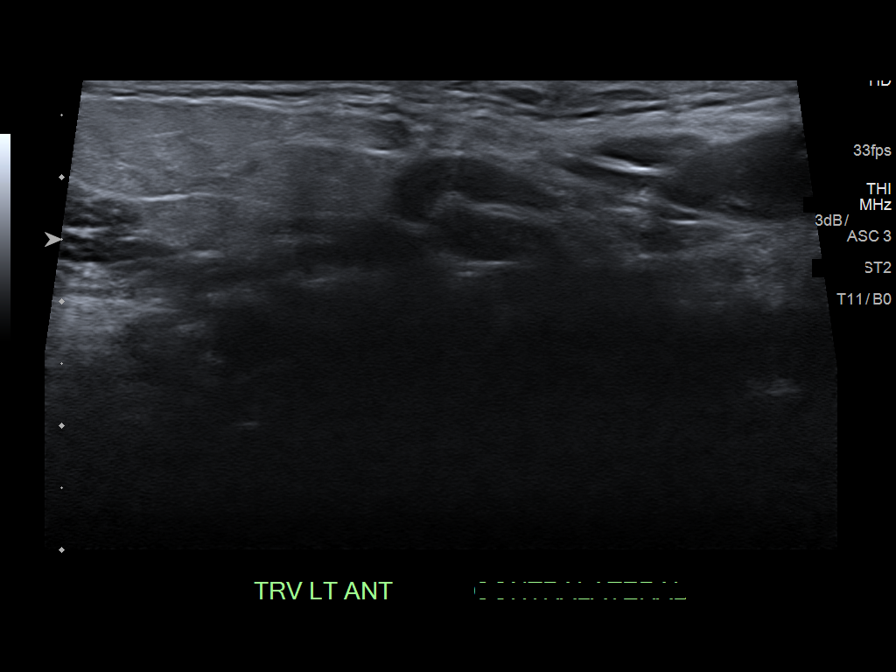

[8 of 8 positions shown; findings below may reference images not displayed]

FINDINGS: Grayscale and color duplex ultrasound performed in the region
clinical concern.

No focal fluid.

Lymph nodes identified in the region of clinical concern with short
axis dimension measuring 7 mm and maintained typical architecture.
IMPRESSION: Typical appearing lymph nodes in the region of clinical concern,
potentially reactive, though nonspecific by ultrasound.

## 2019-08-30 DIAGNOSIS — Z03818 Encounter for observation for suspected exposure to other biological agents ruled out: Secondary | ICD-10-CM | POA: Diagnosis not present

## 2019-08-30 DIAGNOSIS — Z20822 Contact with and (suspected) exposure to covid-19: Secondary | ICD-10-CM | POA: Diagnosis not present

## 2019-08-30 DIAGNOSIS — Z1152 Encounter for screening for COVID-19: Secondary | ICD-10-CM | POA: Diagnosis not present

## 2019-08-30 DIAGNOSIS — R519 Headache, unspecified: Secondary | ICD-10-CM | POA: Diagnosis not present

## 2019-09-04 NOTE — Progress Notes (Signed)
Complete physical exam   Patient: Isaac White   DOB: [birth year]   49 y.o. Male  MRN: [id number] Visit Date: 09/06/2019  Today's healthcare provider: Trinna Post, PA-C   Chief Complaint  Patient presents with  . Annual Exam   Subjective    Isaac White is a 49 y.o. male who presents today for a complete physical exam.  He reports consuming a general diet. The patient does not participate in regular exercise at present. He generally feels well. He reports sleeping well. He does have additional problems to discuss today.   Hypogonadism  He continues on testosterone which is prescribed by his PCP. He would like to check his testosterone checked.   Right Neck Mass  Patient also mentions that he has a knot on the right side of his neck X 1 month. It is not getting bigger. It is not painful. It is not producing any pus or blood. He is not having fevers, chills, weight loss. He had a right sided neck mass a couple of years ago which he reports was different feeling from this. He had an ultrasound of his neck at that time which was normal.   Elevated Blood Pressure  Hypertension, follow-up  BP Readings from Last 3 Encounters:  09/06/19 128/82  10/25/17 (!) 143/86  07/05/17 128/80   Wt Readings from Last 3 Encounters:  09/06/19 204 lb (92.5 kg)  10/25/17 196 lb (88.9 kg)  07/05/17 190 lb (86.2 kg)     He was last seen for hypertension 1 years ago.  BP at that visit was elevaed. Management since that visit includes diet controlled, no medications started.   Use of agents associated with hypertension: none.   Outside blood pressures are Normal. Symptoms: No chest pain No chest pressure  No palpitations No syncope  No dyspnea No orthopnea  No paroxysmal nocturnal dyspnea No lower extremity edema   Pertinent labs: Lab Results  Component Value Date   CHOL 185 04/16/2017   HDL 56 04/16/2017   LDLCALC 118 (H) 04/16/2017   TRIG 56 04/16/2017   CHOLHDL 3.3  04/16/2017   Lab Results  Component Value Date   NA 142 04/16/2017   K 4.6 04/16/2017   CREATININE 1.18 04/16/2017   GFRNONAA 74 04/16/2017   GFRAA 85 04/16/2017   GLUCOSE 88 04/16/2017     The 10-year ASCVD risk score Mikey Bussing DC Jr., et al., 2013) is: 2.5%   ---------------------------------------------------------------------------------------------------  BP Readings from Last 7 Encounters:  09/06/19 128/82  10/25/17 (!) 143/86  07/05/17 128/80  04/15/17 120/80     No past medical history on file. No past surgical history on file. Social History   Socioeconomic History  . Marital status: Single    Spouse name: Not on file  . Number of children: Not on file  . Years of education: Not on file  . Highest education level: Not on file  Occupational History  . Not on file  Tobacco Use  . Smoking status: Never Smoker  . Smokeless tobacco: Never Used  Substance and Sexual Activity  . Alcohol use: Yes    Comment: occasionally  . Drug use: Never  . Sexual activity: Not on file  Other Topics Concern  . Not on file  Social History Narrative  . Not on file   Social Determinants of Health   Financial Resource Strain:   . Difficulty of Paying Living Expenses:   Food Insecurity:   . Worried About Running  Out of Food in the Last Year:   . [place] in the Last Year:   Transportation Needs:   . Lack of Transportation (Medical):   Marland Kitchen Lack of Transportation (Non-Medical):   Physical Activity:   . Days of Exercise per Week:   . Minutes of Exercise per Session:   Stress:   . Feeling of Stress :   Social Connections:   . Frequency of Communication with Friends and Family:   . Frequency of Social Gatherings with Friends and Family:   . Attends Religious Services:   . Active Member of Clubs or Organizations:   . Attends Archivist Meetings:   Marland Kitchen Marital Status:   Intimate Partner Violence:   . Fear of Current or Ex-Partner:   . Emotionally Abused:   Marland Kitchen  Physically Abused:   . Sexually Abused:    Family Status  Relation Name Status  . Mother  Alive  . Father Egbert Garibaldi [place]  . Sister  Alive  . Brother  Alive   Family History  Problem Relation Age of Onset  . Hyperlipidemia Father   . Hypertension Father   . Heart failure Father   . COPD Father   . Kidney disease Father   . Atrial fibrillation Father    No Known Allergies  Patient Care Team: Birdie Sons, MD as PCP - General (Family Medicine)   Medications: Outpatient Medications Prior to Visit  Medication Sig  . testosterone cypionate (DEPOTESTOSTERONE CYPIONATE) 200 MG/ML injection INJECT 1 ML IN THE MUSCLE EVERY 14 DAY AS DIRECTED- DISCARD UNUSED PORTION IMMEDIATELY AFTER USE   No facility-administered medications prior to visit.    Review of Systems  Constitutional: Negative.   HENT: Negative.   Eyes: Negative.   Respiratory: Negative.   Cardiovascular: Negative.   Gastrointestinal: Negative.   Endocrine: Negative.   Genitourinary: Negative.   Musculoskeletal: Negative.   Allergic/Immunologic: Negative.   Neurological: Negative.   Hematological: Negative.   Psychiatric/Behavioral: Negative.       Objective    BP 128/82   Pulse 84   Temp 98.6 F (37 C)   Ht 5\' 10"  (1.778 m)   Wt 204 lb (92.5 kg)   BMI 29.27 kg/m    Physical Exam Constitutional:      Appearance: Normal appearance.  Neck:   Cardiovascular:     Rate and Rhythm: Normal rate and regular rhythm.     Heart sounds: Normal heart sounds.  Pulmonary:     Effort: Pulmonary effort is normal.     Breath sounds: Normal breath sounds.  Abdominal:     General: Bowel sounds are normal.     Palpations: Abdomen is soft.  Lymphadenopathy:     Cervical: Cervical adenopathy present.     Right cervical: Superficial cervical adenopathy present.  Skin:    General: Skin is warm and dry.  Neurological:     Mental Status: He is alert and oriented to person, place, and time. Mental  status is at baseline.  Psychiatric:        Mood and Affect: Mood normal.        Behavior: Behavior normal.       Last depress+No results found for any visits on 09/06/19.  Assessment & Plan    Routine Health Maintenance and Physical Exam  Exercise Activities and Dietary recommendations Goals   None     Immunization History  Administered Date(s) Administered  . Hepatitis A, Adult 04/15/2017, 10/25/2017  .  Hepatitis B, adult 04/15/2017, 10/25/2017  . Hepatitis B, ped/adol 05/17/2017  . Influenza,inj,Quad PF,6+ Mos 10/25/2017  . PFIZER SARS-COV-2 Vaccination 04/07/2019, 05/01/2019  . Td 04/15/2017  . Tdap 09/28/2006    Health Maintenance  Topic Date Due  . Hepatitis C Screening  Never done  . INFLUENZA VACCINE  08/27/2019  . TETANUS/TDAP  04/16/2027  . COVID-19 Vaccine  Completed  . HIV Screening  Completed    Discussed health benefits of physical activity, and encouraged him to engage in regular exercise appropriate for his age and condition.  1. Annual physical exam  - TSH - Lipid panel - Comprehensive metabolic panel - CBC with Differential/Platelet  2. Hypogonadism in male  Check labs and continue to fill testosterone if needed.   - Testosterone,Free and Total  3. Colon cancer screening  - Ambulatory referral to Gastroenterology  4. Encounter for hepatitis C screening test for low risk patient  - Hepatitis C Antibody  5. Mass of right side of neck  Possibly slightly enlarged right cervical lymph node. No other worrisome symptoms. Checking CBC today, HIV negative 2019. Not overly concerning but did offer followup ultrasound if patient wishes. Patient defers at this time but he knows he can contact us if he changes his mind.    No follow-ups on file.     ITrinna Post, PA-C, have reviewed all documentation for this visit. The documentation on 09/06/19 for the exam, diagnosis, procedures, and orders are all accurate and complete.  The  entirety of the information documented in the History of Present Illness, Review of Systems and Physical Exam were personally obtained by me. Portions of this information were initially documented by Wilburt Finlay, CMA and reviewed by me for thoroughness and accuracy.        Paulene Floor  [facility] [phone] (phone) [phone] (fax)  [place]

## 2019-09-06 ENCOUNTER — Encounter: Payer: Self-pay | Admitting: Physician Assistant

## 2019-09-06 ENCOUNTER — Other Ambulatory Visit: Payer: Self-pay

## 2019-09-06 ENCOUNTER — Ambulatory Visit (INDEPENDENT_AMBULATORY_CARE_PROVIDER_SITE_OTHER): Payer: BC Managed Care – PPO | Admitting: Physician Assistant

## 2019-09-06 VITALS — BP 128/82 | HR 84 | Temp 98.6°F | Ht 70.0 in | Wt 204.0 lb

## 2019-09-06 DIAGNOSIS — R221 Localized swelling, mass and lump, neck: Secondary | ICD-10-CM

## 2019-09-06 DIAGNOSIS — Z1211 Encounter for screening for malignant neoplasm of colon: Secondary | ICD-10-CM

## 2019-09-06 DIAGNOSIS — Z1159 Encounter for screening for other viral diseases: Secondary | ICD-10-CM | POA: Diagnosis not present

## 2019-09-06 DIAGNOSIS — E291 Testicular hypofunction: Secondary | ICD-10-CM | POA: Diagnosis not present

## 2019-09-06 DIAGNOSIS — Z0000 Encounter for general adult medical examination without abnormal findings: Secondary | ICD-10-CM | POA: Diagnosis not present

## 2019-09-07 DIAGNOSIS — E291 Testicular hypofunction: Secondary | ICD-10-CM | POA: Diagnosis not present

## 2019-09-07 DIAGNOSIS — Z1159 Encounter for screening for other viral diseases: Secondary | ICD-10-CM | POA: Diagnosis not present

## 2019-09-07 DIAGNOSIS — Z0000 Encounter for general adult medical examination without abnormal findings: Secondary | ICD-10-CM | POA: Diagnosis not present

## 2019-09-11 LAB — COMPREHENSIVE METABOLIC PANEL
ALT: 32 IU/L (ref 0–44)
AST: 27 IU/L (ref 0–40)
Albumin/Globulin Ratio: 2.2 (ref 1.2–2.2)
Albumin: 4.6 g/dL (ref 4.0–5.0)
Alkaline Phosphatase: 53 IU/L (ref 48–121)
BUN/Creatinine Ratio: 14 (ref 9–20)
BUN: 17 mg/dL (ref 6–24)
Bilirubin Total: 0.6 mg/dL (ref 0.0–1.2)
CO2: 22 mmol/L (ref 20–29)
Calcium: 9.3 mg/dL (ref 8.7–10.2)
Chloride: 101 mmol/L (ref 96–106)
Creatinine, Ser: 1.22 mg/dL (ref 0.76–1.27)
GFR calc Af Amer: 80 mL/min/{1.73_m2} (ref >59)
GFR calc non Af Amer: 69 mL/min/{1.73_m2} (ref >59)
Globulin, Total: 2.1 g/dL (ref 1.5–4.5)
Glucose: 81 mg/dL (ref 65–99)
Potassium: 4.3 mmol/L (ref 3.5–5.2)
Sodium: 137 mmol/L (ref 134–144)
Total Protein: 6.7 g/dL (ref 6.0–8.5)

## 2019-09-11 LAB — HEPATITIS C ANTIBODY: Hep C Virus Ab: <0.1 s/co ratio (ref 0.0–0.9)

## 2019-09-11 LAB — CBC WITH DIFFERENTIAL/PLATELET
Basophils Absolute: 0 10*3/uL (ref 0.0–0.2)
Basos: 1 %
EOS (ABSOLUTE): 0 10*3/uL (ref 0.0–0.4)
Eos: 1 %
Hematocrit: 44.8 % (ref 37.5–51.0)
Hemoglobin: 14.9 g/dL (ref 13.0–17.7)
Immature Grans (Abs): 0 10*3/uL (ref 0.0–0.1)
Immature Granulocytes: 1 %
Lymphocytes Absolute: 1.3 10*3/uL (ref 0.7–3.1)
Lymphs: 31 %
MCH: 31 pg (ref 26.6–33.0)
MCHC: 33.3 g/dL (ref 31.5–35.7)
MCV: 93 fL (ref 79–97)
Monocytes Absolute: 0.4 10*3/uL (ref 0.1–0.9)
Monocytes: 9 %
Neutrophils Absolute: 2.4 10*3/uL (ref 1.4–7.0)
Neutrophils: 57 %
Platelets: 189 10*3/uL (ref 150–450)
RBC: 4.8 x10E6/uL (ref 4.14–5.80)
RDW: 12.3 % (ref 11.6–15.4)
WBC: 4.1 10*3/uL (ref 3.4–10.8)

## 2019-09-11 LAB — TESTOSTERONE,FREE AND TOTAL
Testosterone, Free: 11.2 pg/mL (ref 6.8–21.5)
Testosterone: 419 ng/dL (ref 264–916)

## 2019-09-11 LAB — LIPID PANEL
Chol/HDL Ratio: 3.9 ratio (ref 0.0–5.0)
Cholesterol, Total: 209 mg/dL — ABNORMAL HIGH (ref 100–199)
HDL: 54 mg/dL (ref >39)
LDL Chol Calc (NIH): 132 mg/dL — ABNORMAL HIGH (ref 0–99)
Triglycerides: 130 mg/dL (ref 0–149)
VLDL Cholesterol Cal: 23 mg/dL (ref 5–40)

## 2019-09-11 LAB — TSH: TSH: 2.92 u[IU]/mL (ref 0.450–4.500)

## 2019-09-14 ENCOUNTER — Telehealth (INDEPENDENT_AMBULATORY_CARE_PROVIDER_SITE_OTHER): Payer: Self-pay | Admitting: Gastroenterology

## 2019-09-14 ENCOUNTER — Other Ambulatory Visit: Payer: Self-pay

## 2019-09-14 DIAGNOSIS — Z1211 Encounter for screening for malignant neoplasm of colon: Secondary | ICD-10-CM

## 2019-09-14 NOTE — Progress Notes (Signed)
Gastroenterology Pre-Procedure Review  Request Date: Friday 10/13/19 Requesting Physician: Dr. Vicente Males  PATIENT REVIEW QUESTIONS: The patient responded to the following health history questions as indicated:    1. Are you having any GI issues? no 2. Do you have a personal history of Polyps? no 3. Do you have a family history of Colon Cancer or Polyps? no 4. Diabetes Mellitus? no 5. Joint replacements in the past 12 months?no 6. Major health problems in the past 3 months?no 7. Any artificial heart valves, MVP, or defibrillator?no    MEDICATIONS & ALLERGIES:    Patient reports the following regarding taking any anticoagulation/antiplatelet therapy:   Plavix, Coumadin, Eliquis, Xarelto, Lovenox, Pradaxa, Brilinta, or Effient? no Aspirin? no  Patient confirms/reports the following medications:  Current Outpatient Medications  Medication Sig Dispense Refill   testosterone cypionate (DEPOTESTOSTERONE CYPIONATE) 200 MG/ML injection INJECT 1 ML IN THE MUSCLE EVERY 14 DAY AS DIRECTED- DISCARD UNUSED PORTION IMMEDIATELY AFTER USE (Patient not taking: Reported on 09/14/2019) 6 mL 5   No current facility-administered medications for this visit.    Patient confirms/reports the following allergies:  No Known Allergies  No orders of the defined types were placed in this encounter.   AUTHORIZATION INFORMATION Primary Insurance: 1D#: Group #:  Secondary Insurance: 1D#: Group #:  SCHEDULE INFORMATION: Date: Friday 10/13/19 Time: Location:[facility]

## 2019-09-15 ENCOUNTER — Encounter: Payer: 59 | Admitting: Family Medicine

## 2019-10-11 ENCOUNTER — Other Ambulatory Visit: Payer: Self-pay

## 2019-10-11 ENCOUNTER — Other Ambulatory Visit
Admission: RE | Admit: 2019-10-11 | Discharge: 2019-10-11 | Disposition: A | Discharge status: Home / Self Care | Payer: BC Managed Care – PPO | Source: Ambulatory Visit | Attending: Gastroenterology | Admitting: Gastroenterology

## 2019-10-11 DIAGNOSIS — Z01812 Encounter for preprocedural laboratory examination: Secondary | ICD-10-CM | POA: Diagnosis not present

## 2019-10-11 DIAGNOSIS — Z20822 Contact with and (suspected) exposure to covid-19: Secondary | ICD-10-CM | POA: Diagnosis not present

## 2019-10-12 LAB — SARS CORONAVIRUS 2 (TAT 6-24 HRS): SARS Coronavirus 2: NEGATIVE

## 2019-10-13 ENCOUNTER — Ambulatory Visit: Payer: BC Managed Care – PPO | Admitting: Certified Registered"

## 2019-10-13 ENCOUNTER — Other Ambulatory Visit: Payer: Self-pay

## 2019-10-13 ENCOUNTER — Encounter
Admission: RE | Disposition: A | Discharge status: Home / Self Care | Payer: Self-pay | Source: Home / Self Care | Attending: Gastroenterology

## 2019-10-13 ENCOUNTER — Ambulatory Visit
Admission: RE | Admit: 2019-10-13 | Discharge: 2019-10-13 | Disposition: A | Discharge status: Home / Self Care | Payer: BC Managed Care – PPO | Attending: Gastroenterology | Admitting: Gastroenterology

## 2019-10-13 ENCOUNTER — Encounter: Payer: Self-pay | Admitting: Gastroenterology

## 2019-10-13 DIAGNOSIS — Z8249 Family history of ischemic heart disease and other diseases of the circulatory system: Secondary | ICD-10-CM | POA: Diagnosis not present

## 2019-10-13 DIAGNOSIS — K635 Polyp of colon: Secondary | ICD-10-CM | POA: Diagnosis not present

## 2019-10-13 DIAGNOSIS — Z1211 Encounter for screening for malignant neoplasm of colon: Secondary | ICD-10-CM

## 2019-10-13 HISTORY — PX: COLONOSCOPY WITH PROPOFOL: SHX5780

## 2019-10-13 SURGERY — COLONOSCOPY WITH PROPOFOL
Anesthesia: General

## 2019-10-13 MED ORDER — PROPOFOL 500 MG/50ML IV EMUL
INTRAVENOUS | Status: AC
  Filled 2019-10-13: qty 50

## 2019-10-13 MED ORDER — PROPOFOL 10 MG/ML IV BOLUS
INTRAVENOUS | Status: DC | PRN
  Administered 2019-10-13 (×2): 50 mg via INTRAVENOUS

## 2019-10-13 MED ORDER — PROPOFOL 500 MG/50ML IV EMUL
INTRAVENOUS | Status: DC | PRN
  Administered 2019-10-13: 150 ug/kg/min via INTRAVENOUS

## 2019-10-13 MED ORDER — SODIUM CHLORIDE 0.9 % IV SOLN
INTRAVENOUS | Status: DC
  Administered 2019-10-13: 20 mL/h via INTRAVENOUS

## 2019-10-13 NOTE — Transfer of Care (Signed)
Immediate Anesthesia Transfer of Care Note  Patient: Isaac White  Procedure(s) Performed: COLONOSCOPY WITH PROPOFOL (N/A )  Patient Location: PACU and Endoscopy Unit  Anesthesia Type:General  Level of Consciousness: drowsy  Airway & Oxygen Therapy: Patient Spontanous Breathing  Post-op Assessment: Report given to RN  Post vital signs: stable  Last Vitals:  Vitals Value Taken Time  BP    Temp    Pulse    Resp    SpO2      Last Pain:  Vitals:   10/13/19 0830  TempSrc: Temporal  PainSc: 0-No pain         Complications: No complications documented.

## 2019-10-13 NOTE — Anesthesia Postprocedure Evaluation (Signed)
Anesthesia Post Note  Patient: Isaac White  Procedure(s) Performed: COLONOSCOPY WITH PROPOFOL (N/A )  Patient location during evaluation: PACU Anesthesia Type: General Level of consciousness: awake and alert Pain management: pain level controlled Vital Signs Assessment: post-procedure vital signs reviewed and stable Respiratory status: spontaneous breathing, nonlabored ventilation and respiratory function stable Cardiovascular status: blood pressure returned to baseline and stable Postop Assessment: no apparent nausea or vomiting Anesthetic complications: no   No complications documented.   Last Vitals:  Vitals:   10/13/19 0950 10/13/19 1000  BP: 124/79 139/80  Pulse: 83 (!) 58  Resp: 17 16  Temp:    SpO2: 99% 100%    Last Pain:  Vitals:   10/13/19 0830  TempSrc: Temporal  PainSc: 0-No pain                 Brett Canales Dalaysia Harms

## 2019-10-13 NOTE — H&P (Signed)
Jonathon Bellows, MD [address] [place], [place], [place], [address] [address], [place], [place], [place], [address] Phone: [phone]  Fax: [phone]  Primary Care Physician:  Birdie Sons, MD   Pre-Procedure History & Physical: HPI:  Isaac White is a 49 y.o. male is here for an colonoscopy.   No past medical history on file.  No past surgical history on file.  Prior to Admission medications   Medication Sig Start Date End Date Taking? Authorizing Provider  testosterone cypionate (DEPOTESTOSTERONE CYPIONATE) 200 MG/ML injection INJECT 1 ML IN THE MUSCLE EVERY 14 DAY AS DIRECTED- DISCARD UNUSED PORTION IMMEDIATELY AFTER USE Patient not taking: Reported on 09/14/2019 08/15/19   Birdie Sons, MD    Allergies as of 09/14/2019  . (No Known Allergies)    Family History  Problem Relation Age of Onset  . Hyperlipidemia Father   . Hypertension Father   . Heart failure Father   . COPD Father   . Kidney disease Father   . Atrial fibrillation Father     Social History   Socioeconomic History  . Marital status: Single    Spouse name: Not on file  . Number of children: Not on file  . Years of education: Not on file  . Highest education level: Not on file  Occupational History  . Not on file  Tobacco Use  . Smoking status: Never Smoker  . Smokeless tobacco: Never Used  Substance and Sexual Activity  . Alcohol use: Yes    Comment: occasionally  . Drug use: Never  . Sexual activity: Not on file  Other Topics Concern  . Not on file  Social History Narrative  . Not on file   Social Determinants of Health   Financial Resource Strain:   . Difficulty of Paying Living Expenses: Not on file  Food Insecurity:   . Worried About Charity fundraiser in the Last Year: Not on file  . Ran Out of Food in the Last Year: Not on file  Transportation Needs:   . Lack of Transportation (Medical): Not on file  . Lack of Transportation (Non-Medical): Not on file    Physical Activity:   . Days of Exercise per Week: Not on file  . Minutes of Exercise per Session: Not on file  Stress:   . Feeling of Stress : Not on file  Social Connections:   . Frequency of Communication with Friends and Family: Not on file  . Frequency of Social Gatherings with Friends and Family: Not on file  . Attends Religious Services: Not on file  . Active Member of Clubs or Organizations: Not on file  . Attends Archivist Meetings: Not on file  . Marital Status: Not on file  Intimate Partner Violence:   . Fear of Current or Ex-Partner: Not on file  . Emotionally Abused: Not on file  . Physically Abused: Not on file  . Sexually Abused: Not on file    Review of Systems: See HPI, otherwise negative ROS  Physical Exam: BP (!) 149/101   Pulse (!) 51   Temp (!) 96.7 F (35.9 C) (Temporal)   Resp 20   Ht 5\' 10"  (1.778 m)   Wt 87.1 kg   SpO2 100%   BMI 27.55 kg/m  General:   Alert,  pleasant and cooperative in NAD Head:  Normocephalic and atraumatic. Neck:  Supple; no masses or thyromegaly. Lungs:  Clear throughout to auscultation, normal respiratory effort.  Heart:  +S1, +S2, Regular rate and rhythm, No edema. Abdomen:  Soft, nontender and nondistended. Normal bowel sounds, without guarding, and without rebound.   Neurologic:  Alert and  oriented x4;  grossly normal neurologically.  Impression/Plan: Isaac White is here for an colonoscopy to be performed for Screening colonoscopy average risk   Risks, benefits, limitations, and alternatives regarding  colonoscopy have been reviewed with the patient.  Questions have been answered.  All parties agreeable.   Jonathon Bellows, MD  10/13/2019, 9:15 AM

## 2019-10-13 NOTE — Op Note (Signed)
[facility] Patient Name: Isaac White Procedure Date: 10/13/2019 9:15 AM MRN: [id number] Account #: [id number] Date of Birth: 23-Jan-1971 Admit Type: Outpatient Age: 49 Room: [facility] ENDO ROOM 4 Gender: Male Note Status: Finalized Procedure:             Colonoscopy Indications:           Screening for colorectal malignant neoplasm Providers:             Jonathon Bellows MD, MD Referring MD:          Kirstie Peri. Caryn Section, MD (Referring MD) Medicines:             Monitored Anesthesia Care Complications:         No immediate complications. Procedure:             Pre-Anesthesia Assessment:                        - Prior to the procedure, a History and Physical was                         performed, and patient medications, allergies and                         sensitivities were reviewed. The patient's tolerance                         of previous anesthesia was reviewed.                        - The risks and benefits of the procedure and the                         sedation options and risks were discussed with the                         patient. All questions were answered and informed                         consent was obtained.                        - ASA Grade Assessment: II - A patient with mild                         systemic disease.                        After obtaining informed consent, the colonoscope was                         passed under direct vision. Throughout the procedure,                         the patient's blood pressure, pulse, and oxygen                         saturations were monitored continuously. The                         Colonoscope was introduced through the anus  and                         advanced to the the cecum, identified by the                         appendiceal orifice. The colonoscopy was performed                         with ease. The patient tolerated the procedure well.                         The quality of  the bowel preparation was adequate to                         identify polyps 6 mm and larger in size. Findings:      The perianal and digital rectal examinations were normal.      A 5 mm polyp was found in the ascending colon. The polyp was sessile.       The polyp was removed with a cold snare. Resection was complete, but the       polyp tissue was not retrieved.      The exam was otherwise without abnormality on direct and retroflexion       views. Impression:            - Preparation of the colon was fair.                        - One 5 mm polyp in the ascending colon, removed with                         a cold snare. Complete resection. Polyp tissue not                         retrieved.                        - The examination was otherwise normal on direct and                         retroflexion views. Recommendation:        - Discharge patient to home (with escort).                        - Resume previous diet.                        - Continue present medications.                        - Await pathology results.                        - Repeat colonoscopy in 5 years for surveillance. Procedure Code(s):     --- Professional ---                        [phone], Colonoscopy, flexible; with removal of  tumor(s), polyp(s), or other lesion(s) by snare                         technique Diagnosis Code(s):     --- Professional ---                        Z12.11, Encounter for screening for malignant neoplasm                         of colon                        K63.5, Polyp of colon CPT copyright 2019 American Medical Association. All rights reserved. The codes documented in this report are preliminary and upon coder review may  be revised to meet current compliance requirements. Jonathon Bellows, MD Jonathon Bellows MD, MD 10/13/2019 9:39:20 AM This report has been signed electronically. Number of Addenda: 0 Note Initiated On: 10/13/2019 9:15 AM Scope Withdrawal Time:  0 hours 11 minutes 20 seconds  Total Procedure Duration: 0 hours 15 minutes 21 seconds  Estimated Blood Loss:  Estimated blood loss: none.      [facility]

## 2019-10-13 NOTE — Anesthesia Preprocedure Evaluation (Signed)
Anesthesia Evaluation  Patient identified by MRN, date of birth, ID band Patient awake    Reviewed: Allergy & Precautions, H&P , NPO status , Patient's Chart, lab work & pertinent test results  History of Anesthesia Complications Negative for: history of anesthetic complications  Airway Mallampati: II  TM Distance: >3 FB Neck ROM: full    Dental  (+) Teeth Intact   Pulmonary neg pulmonary ROS, neg sleep apnea, neg COPD,    breath sounds clear to auscultation       Cardiovascular (-) angina(-) Past MI and (-) Cardiac Stents negative cardio ROS  (-) dysrhythmias  Rhythm:regular Rate:Normal     Neuro/Psych negative neurological ROS  negative psych ROS   GI/Hepatic negative GI ROS, Neg liver ROS,   Endo/Other  negative endocrine ROS  Renal/GU negative Renal ROS  negative genitourinary   Musculoskeletal   Abdominal   Peds  Hematology negative hematology ROS (+)   Anesthesia Other Findings No past medical history on file.  No past surgical history on file.  BMI    Body Mass Index: 27.55 kg/m      Reproductive/Obstetrics negative OB ROS                             Anesthesia Physical Anesthesia Plan  ASA: I  Anesthesia Plan: General   Post-op Pain Management:    Induction:   PONV Risk Score and Plan: Propofol infusion and TIVA  Airway Management Planned: Nasal Cannula  Additional Equipment:   Intra-op Plan:   Post-operative Plan:   Informed Consent: I have reviewed the patients History and Physical, chart, labs and discussed the procedure including the risks, benefits and alternatives for the proposed anesthesia with the patient or authorized representative who has indicated his/her understanding and acceptance.     Dental Advisory Given  Plan Discussed with: Anesthesiologist, CRNA and Surgeon  Anesthesia Plan Comments:         Anesthesia Quick Evaluation

## 2019-10-13 NOTE — Progress Notes (Addendum)
   10/13/19 0750  Clinical Encounter Type  Visited With Patient  Visit Type Initial  Referral From Chaplain  Consult/Referral To Chaplain  While rounding the [place] waiting area, chaplain briefly visited with patient. He said he was fine and chaplain wished him well and left.

## 2019-11-21 MED ORDER — TESTOSTERONE CYPIONATE 200 MG/ML IM SOLN
INTRAMUSCULAR | 5 refills | Status: DC
Start: 2019-11-21 — End: 2020-03-25

## 2019-11-21 NOTE — Addendum Note (Signed)
Addended by: Birdie Sons on: 11/21/2019 12:17 PM   Modules accepted: Orders

## 2020-01-31 ENCOUNTER — Other Ambulatory Visit: Payer: BC Managed Care – PPO

## 2020-01-31 ENCOUNTER — Other Ambulatory Visit: Payer: Self-pay

## 2020-01-31 DIAGNOSIS — Z20822 Contact with and (suspected) exposure to covid-19: Secondary | ICD-10-CM | POA: Diagnosis not present

## 2020-01-31 NOTE — Addendum Note (Signed)
Addended by: Thalia Bloodgood on: 01/31/2020 05:52 PM   Modules accepted: Orders

## 2020-02-01 DIAGNOSIS — H2511 Age-related nuclear cataract, right eye: Secondary | ICD-10-CM | POA: Diagnosis not present

## 2020-02-01 LAB — SARS-COV-2, NAA 2 DAY TAT

## 2020-02-01 LAB — NOVEL CORONAVIRUS, NAA: SARS-CoV-2, NAA: NOT DETECTED

## 2020-03-20 ENCOUNTER — Encounter: Payer: Self-pay | Admitting: Family Medicine

## 2020-03-25 ENCOUNTER — Other Ambulatory Visit: Payer: Self-pay

## 2020-03-25 MED ORDER — TESTOSTERONE CYPIONATE 200 MG/ML IM SOLN
INTRAMUSCULAR | 5 refills | Status: DC
Start: 2020-03-25 — End: 2020-06-19

## 2020-03-25 NOTE — Telephone Encounter (Signed)
Patient sent mychart message requesting refill. Please advise.

## 2020-04-10 DIAGNOSIS — H5213 Myopia, bilateral: Secondary | ICD-10-CM | POA: Diagnosis not present

## 2020-04-10 DIAGNOSIS — H2511 Age-related nuclear cataract, right eye: Secondary | ICD-10-CM | POA: Diagnosis not present

## 2020-04-16 ENCOUNTER — Encounter: Payer: Self-pay | Admitting: Ophthalmology

## 2020-04-16 ENCOUNTER — Other Ambulatory Visit: Payer: Self-pay

## 2020-04-19 ENCOUNTER — Other Ambulatory Visit: Payer: Self-pay

## 2020-04-19 ENCOUNTER — Other Ambulatory Visit
Admission: RE | Admit: 2020-04-19 | Discharge: 2020-04-19 | Disposition: A | Discharge status: Home / Self Care | Payer: BC Managed Care – PPO | Source: Ambulatory Visit | Attending: Ophthalmology | Admitting: Ophthalmology

## 2020-04-19 DIAGNOSIS — Z01812 Encounter for preprocedural laboratory examination: Secondary | ICD-10-CM | POA: Insufficient documentation

## 2020-04-19 DIAGNOSIS — Z20822 Contact with and (suspected) exposure to covid-19: Secondary | ICD-10-CM | POA: Insufficient documentation

## 2020-04-19 NOTE — Discharge Instructions (Signed)

## 2020-04-20 LAB — SARS CORONAVIRUS 2 (TAT 6-24 HRS): SARS Coronavirus 2: NEGATIVE

## 2020-04-23 ENCOUNTER — Other Ambulatory Visit: Payer: Self-pay

## 2020-04-23 ENCOUNTER — Ambulatory Visit: Payer: BC Managed Care – PPO | Admitting: Anesthesiology

## 2020-04-23 ENCOUNTER — Encounter
Admission: RE | Disposition: A | Discharge status: Home / Self Care | Payer: Self-pay | Source: Home / Self Care | Attending: Ophthalmology

## 2020-04-23 ENCOUNTER — Ambulatory Visit
Admission: RE | Admit: 2020-04-23 | Discharge: 2020-04-23 | Disposition: A | Discharge status: Home / Self Care | Payer: BC Managed Care – PPO | Attending: Ophthalmology | Admitting: Ophthalmology

## 2020-04-23 ENCOUNTER — Encounter: Payer: Self-pay | Admitting: Ophthalmology

## 2020-04-23 DIAGNOSIS — H2511 Age-related nuclear cataract, right eye: Secondary | ICD-10-CM | POA: Diagnosis not present

## 2020-04-23 DIAGNOSIS — H25811 Combined forms of age-related cataract, right eye: Secondary | ICD-10-CM | POA: Diagnosis not present

## 2020-04-23 HISTORY — PX: CATARACT EXTRACTION W/PHACO: SHX586

## 2020-04-23 SURGERY — PHACOEMULSIFICATION, CATARACT, WITH IOL INSERTION
Anesthesia: Monitor Anesthesia Care | Site: Eye | Laterality: Right

## 2020-04-23 MED ORDER — FENTANYL CITRATE (PF) 100 MCG/2ML IJ SOLN
INTRAMUSCULAR | Status: DC | PRN
  Administered 2020-04-23: 100 ug via INTRAVENOUS

## 2020-04-23 MED ORDER — LIDOCAINE HCL (PF) 2 % IJ SOLN
INTRAOCULAR | Status: DC | PRN
  Administered 2020-04-23: 2 mL

## 2020-04-23 MED ORDER — NA CHONDROIT SULF-NA HYALURON 40-17 MG/ML IO SOLN
INTRAOCULAR | Status: DC | PRN
  Administered 2020-04-23: 1 mL via INTRAOCULAR

## 2020-04-23 MED ORDER — BRIMONIDINE TARTRATE-TIMOLOL 0.2-0.5 % OP SOLN
OPHTHALMIC | Status: DC | PRN
  Administered 2020-04-23: 1 [drp] via OPHTHALMIC

## 2020-04-23 MED ORDER — MOXIFLOXACIN HCL 0.5 % OP SOLN
OPHTHALMIC | Status: DC | PRN
  Administered 2020-04-23: 0.2 mL via OPHTHALMIC

## 2020-04-23 MED ORDER — MIDAZOLAM HCL 2 MG/2ML IJ SOLN
INTRAMUSCULAR | Status: DC | PRN
  Administered 2020-04-23: 2 mg via INTRAVENOUS

## 2020-04-23 MED ORDER — EPINEPHRINE PF 1 MG/ML IJ SOLN
INTRAOCULAR | Status: DC | PRN
  Administered 2020-04-23: 41 mL via OPHTHALMIC

## 2020-04-23 MED ORDER — ARMC OPHTHALMIC DILATING DROPS
1.0000 "application " | OPHTHALMIC | Status: DC | PRN
  Administered 2020-04-23 (×3): 1 via OPHTHALMIC

## 2020-04-23 MED ORDER — TETRACAINE HCL 0.5 % OP SOLN
1.0000 [drp] | OPHTHALMIC | Status: DC | PRN
  Administered 2020-04-23 (×3): 1 [drp] via OPHTHALMIC

## 2020-04-23 SURGICAL SUPPLY — 15 items
CANNULA ANT/CHMB 27GA (MISCELLANEOUS) ×4 IMPLANT
GLOVE SURG TRIUMPH 8.0 PF LTX (GLOVE) ×4 IMPLANT
GOWN STRL REUS W/ TWL LRG LVL3 (GOWN DISPOSABLE) ×2 IMPLANT
GOWN STRL REUS W/TWL LRG LVL3 (GOWN DISPOSABLE) ×4
LENS IOL TECNIS EYHANCE 9.5 (Intraocular Lens) ×2 IMPLANT
MARKER SKIN DUAL TIP RULER LAB (MISCELLANEOUS) ×2 IMPLANT
NEEDLE FILTER BLUNT 18X 1/2SAF (NEEDLE) ×1
NEEDLE FILTER BLUNT 18X1 1/2 (NEEDLE) ×1 IMPLANT
PACK EYE AFTER SURG (MISCELLANEOUS) ×2 IMPLANT
PACK OPTHALMIC (MISCELLANEOUS) ×2 IMPLANT
PACK PORFILIO (MISCELLANEOUS) ×2 IMPLANT
SYR 3ML LL SCALE MARK (SYRINGE) ×2 IMPLANT
SYR TB 1ML LUER SLIP (SYRINGE) ×2 IMPLANT
WATER STERILE IRR 250ML POUR (IV SOLUTION) ×2 IMPLANT
WIPE NON LINTING 3.25X3.25 (MISCELLANEOUS) ×2 IMPLANT

## 2020-04-23 NOTE — Transfer of Care (Signed)
Immediate Anesthesia Transfer of Care Note  Patient: Isaac White  Procedure(s) Performed: CATARACT EXTRACTION PHACO AND INTRAOCULAR LENS PLACEMENT (IOC) RIGHT EYHANCE 2.78 00:22.4 (Right Eye)  Patient Location: PACU  Anesthesia Type: MAC  Level of Consciousness: awake, alert  and patient cooperative  Airway and Oxygen Therapy: Patient Spontanous Breathing and Patient connected to supplemental oxygen  Post-op Assessment: Post-op Vital signs reviewed, Patient's Cardiovascular Status Stable, Respiratory Function Stable, Patent Airway and No signs of Nausea or vomiting  Post-op Vital Signs: Reviewed and stable  Complications: No complications documented.

## 2020-04-23 NOTE — Anesthesia Preprocedure Evaluation (Signed)
Anesthesia Evaluation  Patient identified by MRN, date of birth, ID band Patient awake    Reviewed: Allergy & Precautions, NPO status   Airway Mallampati: II  TM Distance: >3 FB     Dental   Pulmonary neg pulmonary ROS, neg recent URI,    Pulmonary exam normal        Cardiovascular  Rhythm:Regular Rate:Normal  BP elevated in preop but no history of HTN and no meds   Neuro/Psych    GI/Hepatic   Endo/Other  Hypogonadism  BMI 30  Renal/GU      Musculoskeletal   Abdominal   Peds  Hematology   Anesthesia Other Findings   Reproductive/Obstetrics                             Anesthesia Physical Anesthesia Plan  ASA: II  Anesthesia Plan: MAC   Post-op Pain Management:    Induction: Intravenous  PONV Risk Score and Plan: TIVA, Midazolam and Treatment may vary due to age or medical condition  Airway Management Planned: Natural Airway and Nasal Cannula  Additional Equipment:   Intra-op Plan:   Post-operative Plan:   Informed Consent: I have reviewed the patients History and Physical, chart, labs and discussed the procedure including the risks, benefits and alternatives for the proposed anesthesia with the patient or authorized representative who has indicated his/her understanding and acceptance.       Plan Discussed with: CRNA  Anesthesia Plan Comments:         Anesthesia Quick Evaluation

## 2020-04-23 NOTE — Anesthesia Procedure Notes (Signed)
Performed by: Jolicia Delira, CRNA Pre-anesthesia Checklist: Patient identified, Emergency Drugs available, Suction available, Timeout performed and Patient being monitored Patient Re-evaluated:Patient Re-evaluated prior to induction Oxygen Delivery Method: Nasal cannula Placement Confirmation: positive ETCO2       

## 2020-04-23 NOTE — Anesthesia Postprocedure Evaluation (Signed)
Anesthesia Post Note  Patient: Isaac White  Procedure(s) Performed: CATARACT EXTRACTION PHACO AND INTRAOCULAR LENS PLACEMENT (IOC) RIGHT EYHANCE 2.78 00:22.4 (Right Eye)     Patient location during evaluation: PACU Anesthesia Type: MAC Level of consciousness: awake Pain management: pain level controlled Vital Signs Assessment: post-procedure vital signs reviewed and stable Respiratory status: respiratory function stable Cardiovascular status: stable Postop Assessment: no apparent nausea or vomiting Anesthetic complications: no   No complications documented.  Veda Canning

## 2020-04-23 NOTE — H&P (Signed)
[facility]   Primary Care Physician:  Birdie Sons, MD Ophthalmologist: Dr. George Ina  Pre-Procedure History & Physical: HPI:  Isaac White is a 50 y.o. male here for cataract surgery.   History reviewed. No pertinent past medical history.  Past Surgical History:  Procedure Laterality Date  . COLONOSCOPY WITH PROPOFOL N/A 10/13/2019   Procedure: COLONOSCOPY WITH PROPOFOL;  Surgeon: Jonathon Bellows, MD;  Location: [facility] ENDOSCOPY;  Service: Gastroenterology;  Laterality: N/A;  . RETINAL DETACHMENT SURGERY      Prior to Admission medications   Medication Sig Start Date End Date Taking? Authorizing Provider  testosterone cypionate (DEPOTESTOSTERONE CYPIONATE) 200 MG/ML injection INJECT 1 ML IN THE MUSCLE EVERY 14 DAY AS DIRECTED- DISCARD UNUSED PORTION IMMEDIATELY AFTER USE 03/25/20  Yes Birdie Sons, MD    Allergies as of 02/06/2020  . (No Known Allergies)    Family History  Problem Relation Age of Onset  . Hyperlipidemia Father   . Hypertension Father   . Heart failure Father   . COPD Father   . Kidney disease Father   . Atrial fibrillation Father     Social History   Socioeconomic History  . Marital status: Single    Spouse name: Not on file  . Number of children: Not on file  . Years of education: Not on file  . Highest education level: Not on file  Occupational History  . Not on file  Tobacco Use  . Smoking status: Never Smoker  . Smokeless tobacco: Never Used  Substance and Sexual Activity  . Alcohol use: Yes    Comment: occasionally  . Drug use: Never  . Sexual activity: Not on file  Other Topics Concern  . Not on file  Social History Narrative  . Not on file   Social Determinants of Health   Financial Resource Strain: Not on file  Food Insecurity: Not on file  Transportation Needs: Not on file  Physical Activity: Not on file  Stress: Not on file  Social Connections: Not on file  Intimate Partner Violence: Not on file    Review of  Systems: See HPI, otherwise negative ROS  Physical Exam: BP (!) 156/107   Pulse 90   Temp 97.8 F (36.6 C) (Temporal)   Ht 5\' 10"  (1.778 m)   Wt 94.8 kg   SpO2 100%   BMI 29.99 kg/m  General:   Alert,  pleasant and cooperative in NAD Head:  Normocephalic and atraumatic. Respiratory:  Normal work of breathing. Cardiovascular:  RRR  Impression/Plan: Isaac White is here for cataract surgery.  Risks, benefits, limitations, and alternatives regarding cataract surgery have been reviewed with the patient.  Questions have been answered.  All parties agreeable.   Birder Robson, MD  04/23/2020, 12:55 PM

## 2020-04-23 NOTE — Op Note (Signed)
PREOPERATIVE DIAGNOSIS:  Nuclear sclerotic cataract of the right eye.   POSTOPERATIVE DIAGNOSIS:  Cataract   OPERATIVE PROCEDURE:@   SURGEON:  Birder Robson, MD.   ANESTHESIA:  Anesthesiologist: Veda Canning, MD CRNA: Mayme Genta, CRNA  1.      Managed anesthesia care. 2.      0.78ml of Shugarcaine was instilled in the eye following the paracentesis.   COMPLICATIONS:  None.   TECHNIQUE:   Stop and chop   DESCRIPTION OF PROCEDURE:  The patient was examined and consented in the preoperative holding area where the aforementioned topical anesthesia was applied to the right eye and then brought back to the Operating Room where the right eye was prepped and draped in the usual sterile ophthalmic fashion and a lid speculum was placed. A paracentesis was created with the side port blade and the anterior chamber was filled with viscoelastic. A near clear corneal incision was performed with the steel keratome. A continuous curvilinear capsulorrhexis was performed with a cystotome followed by the capsulorrhexis forceps. Hydrodissection and hydrodelineation were carried out with BSS on a blunt cannula. The lens was removed in a stop and chop  technique and the remaining cortical material was removed with the irrigation-aspiration handpiece. The capsular bag was inflated with viscoelastic and the Technis [id number]  lens was placed in the capsular bag without complication. The remaining viscoelastic was removed from the eye with the irrigation-aspiration handpiece. The wounds were hydrated. The anterior chamber was flushed with BSS and the eye was inflated to physiologic pressure. 0.76ml of Vigamox was placed in the anterior chamber. The wounds were found to be water tight. The eye was dressed with Combigan. The patient was given protective glasses to wear throughout the day and a shield with which to sleep tonight. The patient was also given drops with which to begin a drop regimen today and will follow-up  with me in one day. Implant Name Type Inv. Item Serial No. Manufacturer Lot No. LRB No. Used Action  LENS IOL TECNIS EYHANCE 9.5 - [id number] Intraocular Lens LENS IOL TECNIS EYHANCE 9.5 [id number] JOHNSON   Right 1 Implanted   Procedure(s): CATARACT EXTRACTION PHACO AND INTRAOCULAR LENS PLACEMENT (IOC) RIGHT EYHANCE 2.78 00:22.4 (Right)  Electronically signed: Birder Robson 04/23/2020 1:28 PM

## 2020-04-24 ENCOUNTER — Encounter: Payer: Self-pay | Admitting: Ophthalmology

## 2020-04-25 ENCOUNTER — Encounter: Payer: Self-pay | Admitting: Ophthalmology

## 2020-04-25 ENCOUNTER — Encounter: Payer: Self-pay | Admitting: Family Medicine

## 2020-04-25 ENCOUNTER — Other Ambulatory Visit: Payer: Self-pay

## 2020-04-25 ENCOUNTER — Ambulatory Visit: Payer: BC Managed Care – PPO | Admitting: Family Medicine

## 2020-04-25 VITALS — BP 168/123 | HR 93 | Temp 99.0°F | Resp 16 | Wt 209.0 lb

## 2020-04-25 DIAGNOSIS — I10 Essential (primary) hypertension: Secondary | ICD-10-CM

## 2020-04-25 DIAGNOSIS — E663 Overweight: Secondary | ICD-10-CM | POA: Diagnosis not present

## 2020-04-25 DIAGNOSIS — F439 Reaction to severe stress, unspecified: Secondary | ICD-10-CM

## 2020-04-25 MED ORDER — HYDROXYZINE HCL 10 MG PO TABS: 10.0000 mg | ORAL_TABLET | Freq: Three times a day (TID) | ORAL | 0 refills | Status: DC | PRN

## 2020-04-25 MED ORDER — HYDROCHLOROTHIAZIDE 12.5 MG PO TABS: 12.5000 mg | ORAL_TABLET | Freq: Every day | ORAL | 3 refills | Status: DC

## 2020-04-25 NOTE — Progress Notes (Signed)
I,April Miller,acting as a scribe for Lavon Paganini, MD.,have documented all relevant documentation on the behalf of Lavon Paganini, MD,as directed by  Lavon Paganini, MD while in the presence of Lavon Paganini, MD.   Established patient visit   Patient: Isaac White   DOB: 01-30-1970   50 y.o. Male  MRN: [id number] Visit Date: 04/25/2020  Today's healthcare provider: Lavon Paganini, MD   Chief Complaint  Patient presents with  . Hypertension   Subjective    Hypertension This is a recurrent problem. The current episode started more than 1 month ago. The problem is unchanged. The problem is uncontrolled. Pertinent negatives include no anxiety, blurred vision, chest pain, headaches, malaise/fatigue, neck pain, orthopnea, palpitations, peripheral edema, PND or shortness of breath. There are no associated agents to hypertension. Risk factors for coronary artery disease include male gender and family history. Past treatments include nothing.    Patient has had his blood pressure checked on several different occassions. Patient's bp is always elevated. Blood pressures 171/135, 152/120, 173/110, 196/121, 156/95. No chest pain, no sob, no headaches. Patient states he can never tell when his bp elevated.  Only recent change is caring for elderly parents.  Social History   Tobacco Use  . Smoking status: Never Smoker  . Smokeless tobacco: Never Used  Substance Use Topics  . Alcohol use: Yes    Comment: occasionally  . Drug use: Never       Medications: Outpatient Medications Prior to Visit  Medication Sig  . testosterone cypionate (DEPOTESTOSTERONE CYPIONATE) 200 MG/ML injection INJECT 1 ML IN THE MUSCLE EVERY 14 DAY AS DIRECTED- DISCARD UNUSED PORTION IMMEDIATELY AFTER USE   No facility-administered medications prior to visit.    Review of Systems  Constitutional: Negative for appetite change, chills, fever and malaise/fatigue.  Eyes: Negative for blurred  vision.  Respiratory: Negative for chest tightness, shortness of breath and wheezing.   Cardiovascular: Negative for chest pain, palpitations, orthopnea and PND.  Gastrointestinal: Negative for abdominal pain, nausea and vomiting.  Musculoskeletal: Negative for neck pain.  Neurological: Negative for headaches.       Objective    BP (!) 168/123 (BP Location: Left Arm, Patient Position: Sitting, Cuff Size: Large)   Pulse 93   Temp 99 F (37.2 C) (Oral)   Resp 16   Wt 209 lb (94.8 kg)   SpO2 96%   BMI 29.99 kg/m     Physical Exam Vitals reviewed.  Constitutional:      General: He is not in acute distress.    Appearance: Normal appearance. He is not diaphoretic.  HENT:     Head: Normocephalic and atraumatic.  Eyes:     General: No scleral icterus.    Conjunctiva/sclera: Conjunctivae normal.  Cardiovascular:     Rate and Rhythm: Normal rate and regular rhythm.     Pulses: Normal pulses.     Heart sounds: Normal heart sounds. No murmur heard.   Pulmonary:     Effort: Pulmonary effort is normal. No respiratory distress.     Breath sounds: Normal breath sounds. No wheezing or rhonchi.  Musculoskeletal:     Cervical back: Neck supple.     Right lower leg: No edema.     Left lower leg: No edema.  Lymphadenopathy:     Cervical: No cervical adenopathy.  Skin:    General: Skin is warm and dry.     Capillary Refill: Capillary refill takes less than 2 seconds.     Findings:  No rash.  Neurological:     Mental Status: He is alert and oriented to person, place, and time. Mental status is at baseline.  Psychiatric:        Mood and Affect: Mood normal.        Behavior: Behavior normal.       No results found for any visits on 04/25/20.  Assessment & Plan     1. Essential hypertension - new diagnosis - elevated BPs in multiple setting - discussed lifestyle changes and DASH diet - start HCTZ 12.5 mg daily - check BMP in 1 month at f/u - reviewed last metabolic panel -  patient would like to make lifestyle changes and get back off of medicaitons in the future  2. Situational stress May be contributing to elevated BP - trial of hydroxyzine prn  3. Overweight Discussed importance of healthy weight management Discussed diet and exercise    Return in about 4 weeks (around 05/23/2020) for BP f/u.      I, Lavon Paganini, MD, have reviewed all documentation for this visit. The documentation on 04/25/20 for the exam, diagnosis, procedures, and orders are all accurate and complete.   Kyerra Vargo, Dionne Bucy, MD, MPH [facility]

## 2020-04-25 NOTE — Patient Instructions (Signed)

## 2020-04-26 ENCOUNTER — Other Ambulatory Visit: Payer: BC Managed Care – PPO

## 2020-04-29 DIAGNOSIS — H2512 Age-related nuclear cataract, left eye: Secondary | ICD-10-CM | POA: Diagnosis not present

## 2020-05-03 ENCOUNTER — Other Ambulatory Visit: Payer: Self-pay

## 2020-05-03 ENCOUNTER — Other Ambulatory Visit
Admission: RE | Admit: 2020-05-03 | Discharge: 2020-05-03 | Disposition: A | Discharge status: Home / Self Care | Payer: BC Managed Care – PPO | Source: Ambulatory Visit | Attending: Ophthalmology | Admitting: Ophthalmology

## 2020-05-03 DIAGNOSIS — Z01812 Encounter for preprocedural laboratory examination: Secondary | ICD-10-CM | POA: Insufficient documentation

## 2020-05-03 DIAGNOSIS — Z20822 Contact with and (suspected) exposure to covid-19: Secondary | ICD-10-CM | POA: Diagnosis not present

## 2020-05-03 LAB — SARS CORONAVIRUS 2 (TAT 6-24 HRS): SARS Coronavirus 2: NEGATIVE

## 2020-05-07 ENCOUNTER — Encounter: Payer: Self-pay | Admitting: Family Medicine

## 2020-05-07 ENCOUNTER — Other Ambulatory Visit: Payer: Self-pay

## 2020-05-07 ENCOUNTER — Encounter
Admission: RE | Disposition: A | Discharge status: Home / Self Care | Payer: Self-pay | Source: Home / Self Care | Attending: Ophthalmology

## 2020-05-07 ENCOUNTER — Ambulatory Visit: Payer: BC Managed Care – PPO | Admitting: Anesthesiology

## 2020-05-07 ENCOUNTER — Encounter: Payer: Self-pay | Admitting: Ophthalmology

## 2020-05-07 ENCOUNTER — Ambulatory Visit
Admission: RE | Admit: 2020-05-07 | Discharge: 2020-05-07 | Disposition: A | Discharge status: Home / Self Care | Payer: BC Managed Care – PPO | Attending: Ophthalmology | Admitting: Ophthalmology

## 2020-05-07 DIAGNOSIS — Z8349 Family history of other endocrine, nutritional and metabolic diseases: Secondary | ICD-10-CM | POA: Insufficient documentation

## 2020-05-07 DIAGNOSIS — H2512 Age-related nuclear cataract, left eye: Secondary | ICD-10-CM | POA: Insufficient documentation

## 2020-05-07 DIAGNOSIS — Z8249 Family history of ischemic heart disease and other diseases of the circulatory system: Secondary | ICD-10-CM | POA: Diagnosis not present

## 2020-05-07 DIAGNOSIS — Z79899 Other long term (current) drug therapy: Secondary | ICD-10-CM | POA: Diagnosis not present

## 2020-05-07 DIAGNOSIS — Z841 Family history of disorders of kidney and ureter: Secondary | ICD-10-CM | POA: Diagnosis not present

## 2020-05-07 DIAGNOSIS — H25812 Combined forms of age-related cataract, left eye: Secondary | ICD-10-CM | POA: Diagnosis not present

## 2020-05-07 DIAGNOSIS — Z825 Family history of asthma and other chronic lower respiratory diseases: Secondary | ICD-10-CM | POA: Insufficient documentation

## 2020-05-07 HISTORY — PX: CATARACT EXTRACTION W/PHACO: SHX586

## 2020-05-07 SURGERY — PHACOEMULSIFICATION, CATARACT, WITH IOL INSERTION
Anesthesia: Monitor Anesthesia Care | Site: Eye | Laterality: Left

## 2020-05-07 MED ORDER — MOXIFLOXACIN HCL 0.5 % OP SOLN
OPHTHALMIC | Status: DC | PRN
  Administered 2020-05-07: 0.2 mL via OPHTHALMIC

## 2020-05-07 MED ORDER — ARMC OPHTHALMIC DILATING DROPS
1.0000 "application " | OPHTHALMIC | Status: DC | PRN
  Administered 2020-05-07 (×3): 1 via OPHTHALMIC

## 2020-05-07 MED ORDER — LIDOCAINE HCL (PF) 2 % IJ SOLN
INTRAOCULAR | Status: DC | PRN
  Administered 2020-05-07: 2 mL

## 2020-05-07 MED ORDER — TETRACAINE HCL 0.5 % OP SOLN
1.0000 [drp] | OPHTHALMIC | Status: DC | PRN
  Administered 2020-05-07 (×3): 1 [drp] via OPHTHALMIC

## 2020-05-07 MED ORDER — FENTANYL CITRATE (PF) 100 MCG/2ML IJ SOLN
INTRAMUSCULAR | Status: DC | PRN
  Administered 2020-05-07 (×2): 50 ug via INTRAVENOUS

## 2020-05-07 MED ORDER — ACETAMINOPHEN 160 MG/5ML PO SOLN: 325.0000 mg | Freq: Once | ORAL | Status: DC

## 2020-05-07 MED ORDER — ACETAMINOPHEN 325 MG PO TABS: 325.0000 mg | ORAL_TABLET | Freq: Once | ORAL | Status: DC

## 2020-05-07 MED ORDER — BRIMONIDINE TARTRATE-TIMOLOL 0.2-0.5 % OP SOLN
OPHTHALMIC | Status: DC | PRN
  Administered 2020-05-07: 1 [drp] via OPHTHALMIC

## 2020-05-07 MED ORDER — EPINEPHRINE PF 1 MG/ML IJ SOLN
INTRAOCULAR | Status: DC | PRN
  Administered 2020-05-07: 53 mL via OPHTHALMIC

## 2020-05-07 MED ORDER — LACTATED RINGERS IV SOLN: INTRAVENOUS | Status: DC

## 2020-05-07 MED ORDER — NA CHONDROIT SULF-NA HYALURON 40-17 MG/ML IO SOLN
INTRAOCULAR | Status: DC | PRN
  Administered 2020-05-07: 1 mL via INTRAOCULAR

## 2020-05-07 MED ORDER — MIDAZOLAM HCL 2 MG/2ML IJ SOLN
INTRAMUSCULAR | Status: DC | PRN
  Administered 2020-05-07 (×2): 1 mg via INTRAVENOUS

## 2020-05-07 SURGICAL SUPPLY — 19 items
CANNULA ANT/CHMB 27GA (MISCELLANEOUS) ×4 IMPLANT
GLOVE SURG TRIUMPH 8.0 PF LTX (GLOVE) ×4 IMPLANT
GOWN STRL REUS W/ TWL LRG LVL3 (GOWN DISPOSABLE) ×2 IMPLANT
GOWN STRL REUS W/TWL LRG LVL3 (GOWN DISPOSABLE) ×4
LENS IOL EYHANCE TORIC 9.0 ×2 IMPLANT
LENS IOL EYHANCE TRC 150 9.0 ×1 IMPLANT
MARKER SKIN DUAL TIP RULER LAB (MISCELLANEOUS) ×2 IMPLANT
NEEDLE FILTER BLUNT 18X 1/2SAF (NEEDLE) ×1
NEEDLE FILTER BLUNT 18X1 1/2 (NEEDLE) ×1 IMPLANT
PACK EYE AFTER SURG (MISCELLANEOUS) ×2 IMPLANT
PACK OPTHALMIC (MISCELLANEOUS) ×2 IMPLANT
PACK PORFILIO (MISCELLANEOUS) ×2 IMPLANT
RING MALYGIN (MISCELLANEOUS) ×2 IMPLANT
SUT ETHILON 10-0 CS-B-6CS-B-6 (SUTURE)
SUTURE EHLN 10-0 CS-B-6CS-B-6 (SUTURE) IMPLANT
SYR 3ML LL SCALE MARK (SYRINGE) ×2 IMPLANT
SYR TB 1ML LUER SLIP (SYRINGE) ×2 IMPLANT
WATER STERILE IRR 250ML POUR (IV SOLUTION) ×2 IMPLANT
WIPE NON LINTING 3.25X3.25 (MISCELLANEOUS) ×2 IMPLANT

## 2020-05-07 NOTE — Anesthesia Postprocedure Evaluation (Signed)
Anesthesia Post Note  Patient: Isaac White  Procedure(s) Performed: CATARACT EXTRACTION PHACO AND INTRAOCULAR LENS PLACEMENT (IOC) LEFT EYHANCE TORIC, MALYUGIN 2.28 00:29.8 (Left Eye)     Patient location during evaluation: PACU Anesthesia Type: MAC Level of consciousness: awake and alert and oriented Pain management: satisfactory to patient Vital Signs Assessment: post-procedure vital signs reviewed and stable Respiratory status: spontaneous breathing, nonlabored ventilation and respiratory function stable Cardiovascular status: blood pressure returned to baseline and stable Postop Assessment: Adequate PO intake and No signs of nausea or vomiting Anesthetic complications: no   No complications documented.  Raliegh Ip

## 2020-05-07 NOTE — Anesthesia Preprocedure Evaluation (Signed)
Anesthesia Evaluation  Patient identified by MRN, date of birth, ID band Patient awake    Reviewed: Allergy & Precautions, H&P , NPO status , Patient's Chart, lab work & pertinent test results  Airway Mallampati: II  TM Distance: >3 FB Neck ROM: full    Dental no notable dental hx.    Pulmonary    Pulmonary exam normal breath sounds clear to auscultation       Cardiovascular hypertension, Normal cardiovascular exam Rhythm:regular Rate:Normal     Neuro/Psych    GI/Hepatic   Endo/Other    Renal/GU      Musculoskeletal   Abdominal   Peds  Hematology   Anesthesia Other Findings   Reproductive/Obstetrics                             Anesthesia Physical Anesthesia Plan  ASA: II  Anesthesia Plan: MAC   Post-op Pain Management:    Induction:   PONV Risk Score and Plan: 1 and Treatment may vary due to age or medical condition, Midazolam and TIVA  Airway Management Planned:   Additional Equipment:   Intra-op Plan:   Post-operative Plan:   Informed Consent: I have reviewed the patients History and Physical, chart, labs and discussed the procedure including the risks, benefits and alternatives for the proposed anesthesia with the patient or authorized representative who has indicated his/her understanding and acceptance.     Dental Advisory Given  Plan Discussed with: CRNA  Anesthesia Plan Comments:         Anesthesia Quick Evaluation

## 2020-05-07 NOTE — Discharge Instructions (Signed)

## 2020-05-07 NOTE — Transfer of Care (Signed)
Immediate Anesthesia Transfer of Care Note  Patient: Isaac White  Procedure(s) Performed: CATARACT EXTRACTION PHACO AND INTRAOCULAR LENS PLACEMENT (IOC) LEFT EYHANCE TORIC, MALYUGIN 2.28 00:29.8 (Left Eye)  Patient Location: PACU  Anesthesia Type: MAC  Level of Consciousness: awake, alert  and patient cooperative  Airway and Oxygen Therapy: Patient Spontanous Breathing and Patient connected to supplemental oxygen  Post-op Assessment: Post-op Vital signs reviewed, Patient's Cardiovascular Status Stable, Respiratory Function Stable, Patent Airway and No signs of Nausea or vomiting  Post-op Vital Signs: Reviewed and stable  Complications: No complications documented.

## 2020-05-07 NOTE — Anesthesia Procedure Notes (Signed)
Procedure Name: MAC Date/Time: 05/07/2020 10:19 AM Performed by: Cameron Ali, CRNA Pre-anesthesia Checklist: Patient identified, Emergency Drugs available, Suction available, Timeout performed and Patient being monitored Patient Re-evaluated:Patient Re-evaluated prior to induction Oxygen Delivery Method: Nasal cannula Placement Confirmation: positive ETCO2

## 2020-05-07 NOTE — H&P (Signed)
[facility]   Primary Care Physician:  Birdie Sons, MD Ophthalmologist: Dr. George Ina  Pre-Procedure History & Physical: HPI:  Isaac White is a 50 y.o. male here for cataract surgery.   History reviewed. No pertinent past medical history.  Past Surgical History:  Procedure Laterality Date  . CATARACT EXTRACTION W/PHACO Right 04/23/2020   Procedure: CATARACT EXTRACTION PHACO AND INTRAOCULAR LENS PLACEMENT ([place]) RIGHT EYHANCE 2.78 00:22.4;  Surgeon: Birder Robson, MD;  Location: [place];  Service: Ophthalmology;  Laterality: Right;  . COLONOSCOPY WITH PROPOFOL N/A 10/13/2019   Procedure: COLONOSCOPY WITH PROPOFOL;  Surgeon: Jonathon Bellows, MD;  Location: [facility] ENDOSCOPY;  Service: Gastroenterology;  Laterality: N/A;  . RETINAL DETACHMENT SURGERY      Prior to Admission medications   Medication Sig Start Date End Date Taking? Authorizing Provider  hydrochlorothiazide (HYDRODIURIL) 12.5 MG tablet Take 1 tablet (12.5 mg total) by mouth daily. 04/25/20  Yes Bacigalupo, Dionne Bucy, MD  hydrOXYzine (ATARAX/VISTARIL) 10 MG tablet Take 1 tablet (10 mg total) by mouth 3 (three) times daily as needed. 04/25/20  Yes Bacigalupo, Dionne Bucy, MD  testosterone cypionate (DEPOTESTOSTERONE CYPIONATE) 200 MG/ML injection INJECT 1 ML IN THE MUSCLE EVERY 14 DAY AS DIRECTED- DISCARD UNUSED PORTION IMMEDIATELY AFTER USE 03/25/20  Yes Birdie Sons, MD    Allergies as of 02/06/2020  . (No Known Allergies)    Family History  Problem Relation Age of Onset  . Hyperlipidemia Father   . Hypertension Father   . Heart failure Father   . COPD Father   . Kidney disease Father   . Atrial fibrillation Father     Social History   Socioeconomic History  . Marital status: Single    Spouse name: Not on file  . Number of children: Not on file  . Years of education: Not on file  . Highest education level: Not on file  Occupational History  . Not on file  Tobacco Use  . Smoking status:  Never Smoker  . Smokeless tobacco: Never Used  Substance and Sexual Activity  . Alcohol use: Yes    Comment: occasionally  . Drug use: Never  . Sexual activity: Not on file  Other Topics Concern  . Not on file  Social History Narrative  . Not on file   Social Determinants of Health   Financial Resource Strain: Not on file  Food Insecurity: Not on file  Transportation Needs: Not on file  Physical Activity: Not on file  Stress: Not on file  Social Connections: Not on file  Intimate Partner Violence: Not on file    Review of Systems: See HPI, otherwise negative ROS  Physical Exam: BP (!) 159/105   Pulse 81   Temp (!) 97.3 F (36.3 C) (Temporal)   Ht 5\' 10"  (1.778 m)   Wt 93 kg   SpO2 97%   BMI 29.41 kg/m  General:   Alert,  pleasant and cooperative in NAD Head:  Normocephalic and atraumatic. Respiratory:  Normal work of breathing. Cardiovascular:  RRR  Impression/Plan: Isaac White is here for cataract surgery.  Risks, benefits, limitations, and alternatives regarding cataract surgery have been reviewed with the patient.  Questions have been answered.  All parties agreeable.   Birder Robson, MD  05/07/2020, 10:10 AM

## 2020-05-07 NOTE — Op Note (Signed)
PREOPERATIVE DIAGNOSIS:  Nuclear sclerotic cataract of the left eye.   POSTOPERATIVE DIAGNOSIS:  Nuclear sclerotic cataract of the left eye.   OPERATIVE PROCEDURE: Procedure(s): CATARACT EXTRACTION PHACO AND INTRAOCULAR LENS PLACEMENT (IOC) LEFT EYHANCE TORIC, MALYUGIN 2.28 00:29.8   SURGEON:  Birder Robson, MD.   ANESTHESIA: 1.      Managed anesthesia care. 2.     0.47ml os Shugarcaine was instilled following the paracentesis [email or web address]@   COMPLICATIONS:  None.   TECHNIQUE:   Stop and chop    DESCRIPTION OF PROCEDURE:  The patient was examined and consented in the preoperative holding area where the aforementioned topical anesthesia was applied to the left eye.  The patient was brought back to the Operating Room where he was sat upright on the gurney and given a target to fixate upon while the eye was marked at the 3:00 and 9:00 position.  The patient was then reclined on the operating table.  The eye was prepped and draped in the usual sterile ophthalmic fashion and a lid speculum was placed. A paracentesis was created with the side port blade and the anterior chamber was filled with viscoelastic. A near clear corneal incision was performed with the steel keratome. A continuous curvilinear capsulorrhexis was performed with a cystotome followed by the capsulorrhexis forceps. Hydrodissection and hydrodelineation were carried out with BSS on a blunt cannula. The lens was removed in a stop and chop technique and the remaining cortical material was removed with the irrigation-aspiration handpiece. The eye was inflated with viscoelastic and the ZCT lens was placed in the eye and rotated to within a few degrees of the predetermined orientation.  The remaining viscoelastic was removed from the eye.  The Sinskey hook was used to rotate the toric lens into its final resting place at 021 degrees.  0.1 ml of Vigamox was placed in the anterior chamber. The eye was inflated to a physiologic pressure and  found to be watertight.  The eye was dressed with Vigamox. The patient was given protective glasses to wear throughout the day and a shield with which to sleep tonight. The patient was also given drops with which to begin a drop regimen today and will follow-up with me in one day. Implant Name Type Inv. Item Serial No. Manufacturer Lot No. LRB No. Used Action  LENS IOL EYHANCE TORIC 9.0 - [id number]  [place] 9.0 [id number] JOHNSON   Left 1 Implanted   Procedure(s): CATARACT EXTRACTION PHACO AND INTRAOCULAR LENS PLACEMENT (IOC) LEFT EYHANCE TORIC, MALYUGIN 2.28 00:29.8 (Left)  Electronically signed: Birder Robson 4/12/202210:40 AM

## 2020-05-08 ENCOUNTER — Encounter: Payer: Self-pay | Admitting: Ophthalmology

## 2020-05-20 ENCOUNTER — Other Ambulatory Visit: Payer: Self-pay

## 2020-05-20 MED ORDER — HYDROCHLOROTHIAZIDE 25 MG PO TABS: 25.0000 mg | ORAL_TABLET | Freq: Every day | ORAL | 3 refills | Status: DC

## 2020-05-24 ENCOUNTER — Ambulatory Visit: Payer: BC Managed Care – PPO | Admitting: Family Medicine

## 2020-05-24 ENCOUNTER — Encounter: Payer: Self-pay | Admitting: Family Medicine

## 2020-05-24 ENCOUNTER — Other Ambulatory Visit: Payer: Self-pay

## 2020-05-24 VITALS — BP 162/105 | HR 97 | Temp 98.5°F | Resp 16 | Ht 70.0 in | Wt 211.0 lb

## 2020-05-24 DIAGNOSIS — I10 Essential (primary) hypertension: Secondary | ICD-10-CM

## 2020-05-24 MED ORDER — VALSARTAN 80 MG PO TABS: 80.0000 mg | ORAL_TABLET | Freq: Every day | ORAL | 2 refills | Status: DC

## 2020-05-24 NOTE — Patient Instructions (Signed)
.   We'll contact you in about two weeks to get labs to check on your potassium levels   Start taking valsartan 80mg  once a day along with the hctz once a day

## 2020-05-24 NOTE — Progress Notes (Signed)
Established patient visit   Patient: Isaac White   DOB: 05/31/1970   50 y.o. Male  MRN: [id number] Visit Date: 05/24/2020  Today's healthcare provider: Lelon Huh, MD   Chief Complaint  Patient presents with  . Hypertension   Subjective    HPI  Hypertension, follow-up  BP Readings from Last 3 Encounters:  05/24/20 (!) 162/105  05/07/20 (!) 153/112  04/25/20 (!) 168/123   Wt Readings from Last 3 Encounters:  05/24/20 211 lb (95.7 kg)  05/07/20 205 lb (93 kg)  04/25/20 209 lb (94.8 kg)     He was last seen for hypertension 6 months ago.  BP at that visit was 168/123. Management since that visit includes start HCTZ 12.5mg  daily. Patient reports that he is now taking 25mg  daily, and has not noticed an improvement in his BP.   He reports good compliance with treatment. He is not having side effects.  He is following a Regular diet. He is not exercising. He does not smoke.  Use of agents associated with hypertension: none.   Outside blood pressures are checked daily. He reports that his BP is averaging in the 160s/100s.  Symptoms: No chest pain No chest pressure  No palpitations No syncope  No dyspnea No orthopnea  No paroxysmal nocturnal dyspnea No lower extremity edema   Pertinent labs: Lab Results  Component Value Date   CHOL 209 (H) 09/07/2019   HDL 54 09/07/2019   LDLCALC 132 (H) 09/07/2019   TRIG 130 09/07/2019   CHOLHDL 3.9 09/07/2019   Lab Results  Component Value Date   NA 137 09/07/2019   K 4.3 09/07/2019   CREATININE 1.22 09/07/2019   GFRNONAA 69 09/07/2019   GFRAA 80 09/07/2019   GLUCOSE 81 09/07/2019     The 10-year ASCVD risk score Mikey Bussing DC Jr., et al., 2013) is: 5.4%   Follow up for situational stress  The patient was last seen for this 1 months ago. Changes made at last visit include start trial of hydroxyzine prn.  He reports good compliance with treatment. He feels that condition is Unchanged. He is not having side effects.       Medications: Outpatient Medications Prior to Visit  Medication Sig  . hydrochlorothiazide (HYDRODIURIL) 25 MG tablet Take 1 tablet (25 mg total) by mouth daily.  . hydrOXYzine (ATARAX/VISTARIL) 10 MG tablet Take 1 tablet (10 mg total) by mouth 3 (three) times daily as needed.  . testosterone cypionate (DEPOTESTOSTERONE CYPIONATE) 200 MG/ML injection INJECT 1 ML IN THE MUSCLE EVERY 14 DAY AS DIRECTED- DISCARD UNUSED PORTION IMMEDIATELY AFTER USE   No facility-administered medications prior to visit.    Review of Systems  Constitutional: Negative for activity change and fatigue.  Respiratory: Negative for cough and shortness of breath.   Cardiovascular: Negative for chest pain, palpitations and leg swelling.  Neurological: Negative for dizziness, light-headedness and headaches.  Psychiatric/Behavioral: Negative for self-injury, sleep disturbance and suicidal ideas. The patient is not nervous/anxious.         Objective    BP (!) 162/105   Pulse 97   Temp 98.5 F (36.9 C)   Resp 16   Ht 5\' 10"  (1.778 m)   Wt 211 lb (95.7 kg)   BMI 30.28 kg/m    Physical Exam   General appearance: Overweight male, cooperative and in no acute distress Head: Normocephalic, without obvious abnormality, atraumatic Respiratory: Respirations even and unlabored, normal respiratory rate Extremities: All extremities are intact.  Skin: Skin  color, texture, turgor normal. No rashes seen  Psych: Appropriate mood and affect. Neurologic: Mental status: Alert, oriented to person, place, and time, thought content appropriate.     Assessment & Plan     1. Essential hypertension Slightly improvement with addition of hctz which he is tolerating well. Will add valsartan (DIOVAN) 80 MG tablet; Take 1 tablet (80 mg total) by mouth daily.  Dispense: 30 tablet; Refill: 2   Will contact patient in two weeks to check renal panel.  Counseled on low sodium diet and weight loss.       The entirety of the  information documented in the History of Present Illness, Review of Systems and Physical Exam were personally obtained by me. Portions of this information were initially documented by the CMA and reviewed by me for thoroughness and accuracy.      Lelon Huh, MD  [facility] [phone] (phone) [phone] (fax)  [place]

## 2020-05-27 ENCOUNTER — Ambulatory Visit: Payer: Self-pay | Admitting: Family Medicine

## 2020-06-12 ENCOUNTER — Encounter: Payer: Self-pay | Admitting: Family Medicine

## 2020-06-12 ENCOUNTER — Other Ambulatory Visit: Payer: Self-pay | Admitting: Family Medicine

## 2020-06-12 DIAGNOSIS — I10 Essential (primary) hypertension: Secondary | ICD-10-CM

## 2020-06-12 NOTE — Progress Notes (Unsigned)
See my chart message

## 2020-06-18 ENCOUNTER — Encounter: Payer: Self-pay | Admitting: Family Medicine

## 2020-06-18 DIAGNOSIS — E291 Testicular hypofunction: Secondary | ICD-10-CM

## 2020-06-19 MED ORDER — TESTOSTERONE CYPIONATE 200 MG/ML IM SOLN: INTRAMUSCULAR | 0 refills | Status: DC

## 2020-07-16 ENCOUNTER — Other Ambulatory Visit: Payer: Self-pay | Admitting: Family Medicine

## 2020-08-17 ENCOUNTER — Other Ambulatory Visit: Payer: Self-pay | Admitting: Family Medicine

## 2020-08-17 NOTE — Telephone Encounter (Signed)
Requested Prescriptions  Pending Prescriptions Disp Refills  . valsartan (DIOVAN) 80 MG tablet [Pharmacy Med Name: VALSARTAN 80 MG TABLET] 30 tablet 0    Sig: TAKE 1 TABLET BY MOUTH EVERY DAY     Cardiovascular:  Angiotensin Receptor Blockers Failed - 08/17/2020 10:25 AM      Failed - Cr in normal range and within 180 days    Creatinine, Ser  Date Value Ref Range Status  09/07/2019 1.22 0.76 - 1.27 mg/dL Final         Failed - K in normal range and within 180 days    Potassium  Date Value Ref Range Status  09/07/2019 4.3 3.5 - 5.2 mmol/L Final         Failed - Last BP in normal range    BP Readings from Last 1 Encounters:  05/24/20 (!) 162/105         Passed - Patient is not pregnant      Passed - Valid encounter within last 6 months    Recent Outpatient Visits          2 months ago Essential hypertension   [place], Donald E, MD   3 months ago Essential hypertension   [facility] [place], Dionne Bucy, MD   11 months ago Annual physical exam   [facility] Carles Collet M, [place]   2 years ago Mass of right side of neck   [facility] Birdie Sons, MD   3 years ago Hypogonadism male   [place], Kirstie Peri, MD      Future Appointments            In 2 weeks Fisher, Kirstie Peri, MD [facility] [place]

## 2020-09-06 ENCOUNTER — Ambulatory Visit (INDEPENDENT_AMBULATORY_CARE_PROVIDER_SITE_OTHER): Payer: BC Managed Care – PPO | Admitting: Family Medicine

## 2020-09-06 ENCOUNTER — Encounter: Payer: Self-pay | Admitting: Family Medicine

## 2020-09-06 ENCOUNTER — Other Ambulatory Visit: Payer: Self-pay

## 2020-09-06 VITALS — BP 135/78 | HR 80 | Temp 98.2°F | Resp 16 | Ht 70.0 in | Wt 210.0 lb

## 2020-09-06 DIAGNOSIS — I10 Essential (primary) hypertension: Secondary | ICD-10-CM | POA: Diagnosis not present

## 2020-09-06 DIAGNOSIS — E291 Testicular hypofunction: Secondary | ICD-10-CM | POA: Diagnosis not present

## 2020-09-06 DIAGNOSIS — Z125 Encounter for screening for malignant neoplasm of prostate: Secondary | ICD-10-CM | POA: Diagnosis not present

## 2020-09-06 DIAGNOSIS — R3911 Hesitancy of micturition: Secondary | ICD-10-CM

## 2020-09-06 DIAGNOSIS — Z0000 Encounter for general adult medical examination without abnormal findings: Secondary | ICD-10-CM

## 2020-09-06 NOTE — Patient Instructions (Signed)
Please review the attached list of medications and notify my office if there are any errors.   The CDC recommends two doses of Shingrix (the shingles vaccine) separated by 2 to 6 months for adults age 50 years and older. I recommend checking with your insurance plan regarding coverage for this vaccine.   

## 2020-09-06 NOTE — Progress Notes (Signed)
Complete physical exam   Patient: Isaac White   DOB: October 04, 1970   50 y.o. Male  MRN: [id number] Visit Date: 09/06/2020  Today's healthcare provider: Lelon Huh, MD   Chief Complaint  Patient presents with   Annual Exam   Hypogonadism   Subjective    Isaac White is a 50 y.o. male who presents today for a complete physical exam.  He reports consuming a general diet. Gym/ health club routine includes walking and weight lifting. He generally feels fairly well. He reports sleeping fairly well. He does have additional problems to discuss today.  HPI  Hypertension, follow-up  BP Readings from Last 3 Encounters:  09/06/20 135/78  05/24/20 (!) 162/105  05/07/20 (!) 153/112   Wt Readings from Last 3 Encounters:  09/06/20 210 lb (95.3 kg)  05/24/20 211 lb (95.7 kg)  05/07/20 205 lb (93 kg)     He was last seen for hypertension 3 months ago.  BP at that visit was 162/105. Management since that visit includes adding valsartan (DIOVAN) 80 MG tablet; Take 1 tablet (80 mg total) by mouth daily.    He reports good compliance with treatment. He is not having side effects.  He is following a Regular diet. He is exercising. He does not smoke.  Use of agents associated with hypertension: none.   Outside blood pressures are 130/85. Symptoms: No chest pain No chest pressure  No palpitations No syncope  No dyspnea No orthopnea  No paroxysmal nocturnal dyspnea No lower extremity edema   Pertinent labs: Lab Results  Component Value Date   CHOL 209 (H) 09/07/2019   HDL 54 09/07/2019   LDLCALC 132 (H) 09/07/2019   TRIG 130 09/07/2019   CHOLHDL 3.9 09/07/2019   Lab Results  Component Value Date   NA 137 09/07/2019   K 4.3 09/07/2019   CREATININE 1.22 09/07/2019   GFRNONAA 69 09/07/2019   GFRAA 80 09/07/2019   GLUCOSE 81 09/07/2019     The 10-year ASCVD risk score Mikey Bussing DC Jr., et al., 2013) is: 4.4%    ---------------------------------------------------------------------------------------------------   Follow up for Hypogonadism:  The patient was last seen for this 1  year  ago. Changes made at last visit include none.  He reports good compliance with treatment. He feels that condition is Unchanged. He is not having side effects.   He does reports he has had more urinary hesitancy and post void dribbling for several months, but no nocturia.  -----------------------------------------------------------------------------------------   No past medical history on file. Past Surgical History:  Procedure Laterality Date   CATARACT EXTRACTION W/PHACO Right 04/23/2020   Procedure: CATARACT EXTRACTION PHACO AND INTRAOCULAR LENS PLACEMENT ([place]) RIGHT EYHANCE 2.78 00:22.4;  Surgeon: Birder Robson, MD;  Location: [place];  Service: Ophthalmology;  Laterality: Right;   CATARACT EXTRACTION W/PHACO Left 05/07/2020   Procedure: CATARACT EXTRACTION PHACO AND INTRAOCULAR LENS PLACEMENT ([facility]) LEFT EYHANCE TORIC, MALYUGIN 2.28 00:29.8;  Surgeon: Birder Robson, MD;  Location: [place];  Service: Ophthalmology;  Laterality: Left;   COLONOSCOPY WITH PROPOFOL N/A 10/13/2019   Procedure: COLONOSCOPY WITH PROPOFOL;  Surgeon: Jonathon Bellows, MD;  Location: [facility] END[facility]COPY;  Service: Gastroenterology;  Laterality: N/A;   RETINAL DETACHMENT SURGERY     Social History   Socioeconomic History   Marital status: Single    Spouse name: Not on file   Number of children: Not on file   Years of education: Not on file   Highest education level: Not on  file  Occupational History   Not on file  Tobacco Use   Smoking status: Never   Smokeless tobacco: Never  Substance and Sexual Activity   Alcohol use: Yes    Comment: occasionally   Drug use: Never   Sexual activity: Not on file  Other Topics Concern   Not on file  Social History Narrative   Not on file   Social Determinants of  Health   Financial Resource Strain: Not on file  Food Insecurity: Not on file  Transportation Needs: Not on file  Physical Activity: Not on file  Stress: Not on file  Social Connections: Not on file  Intimate Partner Violence: Not on file   Family Status  Relation Name Status   Mother  Alive   Father Ferenc Bartolucci Senior Alive   Sister  Alive   Brother  Alive   Family History  Problem Relation Age of Onset   Hyperlipidemia Father    Hypertension Father    Heart failure Father    COPD Father    Kidney disease Father    Atrial fibrillation Father    No Known Allergies  Patient Care Team: Birdie Sons, MD as PCP - General (Family Medicine)   Medications: Outpatient Medications Prior to Visit  Medication Sig   hydrochlorothiazide (HYDRODIURIL) 25 MG tablet TAKE 1 TABLET (25 MG TOTAL) BY MOUTH DAILY.   hydrOXYzine (ATARAX/VISTARIL) 10 MG tablet Take 1 tablet (10 mg total) by mouth 3 (three) times daily as needed.   testosterone cypionate (DEPOTESTOSTERONE CYPIONATE) 200 MG/ML injection INJECT 1 ML IN THE MUSCLE EVERY 14 DAY AS DIRECTED- DISCARD UNUSED PORTION IMMEDIATELY AFTER USE   valsartan (DIOVAN) 80 MG tablet TAKE 1 TABLET BY MOUTH EVERY DAY   No facility-administered medications prior to visit.    Review of Systems  Constitutional:  Negative for appetite change, chills, fatigue and fever.  HENT:  Negative for congestion, ear pain, hearing loss, nosebleeds and trouble swallowing.   Eyes:  Negative for pain and visual disturbance.  Respiratory:  Negative for cough, chest tightness and shortness of breath.   Cardiovascular:  Negative for chest pain, palpitations and leg swelling.  Gastrointestinal:  Negative for abdominal pain, blood in stool, constipation, diarrhea, nausea and vomiting.  Endocrine: Negative for polydipsia, polyphagia and polyuria.  Genitourinary:  Negative for dysuria and flank pain.  Musculoskeletal:  Negative for arthralgias, back pain, joint  swelling, myalgias and neck stiffness.  Skin:  Negative for color change, rash and wound.  Neurological:  Negative for dizziness, tremors, seizures, speech difficulty, weakness, light-headedness and headaches.  Psychiatric/Behavioral:  Negative for behavioral problems, confusion, decreased concentration, dysphoric mood and sleep disturbance. The patient is not nervous/anxious.   All other systems reviewed and are negative.    Objective    BP 135/78 (BP Location: Left Arm, Patient Position: Sitting, Cuff Size: Large)   Pulse 80   Temp 98.2 F (36.8 C) (Temporal)   Resp 16   Ht '5\' 10"'$  (1.778 m)   Wt 210 lb (95.3 kg)   BMI 30.13 kg/m    Physical Exam   General Appearance:    Overweight male. Alert, cooperative, in no acute distress, appears stated age  Head:    Normocephalic, without obvious abnormality, atraumatic  Eyes:    PERRL, conjunctiva/corneas clear, EOM's intact, fundi    benign, both eyes       Ears:    Normal TM's and external ear canals, both ears  Neck:   Supple, symmetrical, trachea  midline, no adenopathy;       thyroid:  No enlargement/tenderness/nodules; no carotid   bruit or JVD  Back:     Symmetric, no curvature, ROM normal, no CVA tenderness  Lungs:     Clear to auscultation bilaterally, respirations unlabored  Chest wall:    No tenderness or deformity  Heart:    Normal heart rate. Normal rhythm. No murmurs, rubs, or gallops.  S1 and S2 normal  Abdomen:     Soft, non-tender, bowel sounds active all four quadrants,    no masses, no organomegaly  Genitalia:    deferred  Rectal:    deferred  Extremities:   All extremities are intact. No cyanosis or edema  Pulses:   2+ and symmetric all extremities  Skin:   Skin color, texture, turgor normal, no rashes or lesions  Lymph nodes:   Cervical, supraclavicular, and axillary nodes normal  Neurologic:   CNII-XII intact. Normal strength, sensation and reflexes      throughout     Last depression screening scores PHQ  2/9 Scores 09/06/2020 09/06/2019 04/15/2017  PHQ - 2 Score 0 0 0  PHQ- 9 Score - - 0   Last fall risk screening Fall Risk  09/06/2019  Falls in the past year? 0  Number falls in past yr: 0  Injury with Fall? 0  Follow up Falls evaluation completed   Last Audit-C alcohol use screening Alcohol Use Disorder Test (AUDIT) 09/06/2019  1. How often do you have a drink containing alcohol? 2  2. How many drinks containing alcohol do you have on a typical day when you are drinking? 1  3. How often do you have six or more drinks on one occasion? 2  AUDIT-C Score 5  4. How often during the last year have you found that you were not able to stop drinking once you had started? 0  5. How often during the last year have you failed to do what was normally expected from you because of drinking? 0  6. How often during the last year have you needed a first drink in the morning to get yourself going after a heavy drinking session? 0  7. How often during the last year have you had a feeling of guilt of remorse after drinking? 0  8. How often during the last year have you been unable to remember what happened the night before because you had been drinking? 0  9. Have you or someone else been injured as a result of your drinking? 0  10. Has a relative or friend or a doctor or another health worker been concerned about your drinking or suggested you cut down? 0  Alcohol Use Disorder Identification Test Final Score (AUDIT) 5  Alcohol Brief Interventions/Follow-up AUDIT Score <7 follow-up not indicated   A score of 3 or more in women, and 4 or more in men indicates increased risk for alcohol abuse, EXCEPT if all of the points are from question 1   No results found for any visits on 09/06/20.  Assessment & Plan    Routine Health Maintenance and Physical Exam  Exercise Activities and Dietary recommendations  Goals   None     Immunization History  Administered Date(s) Administered   Hepatitis A, Adult  04/15/2017, 10/25/2017   Hepatitis B, adult 04/15/2017, 10/25/2017   Hepatitis B, ped/adol 05/17/2017   Influenza,inj,Quad PF,6+ Mos 10/25/2017   PFIZER(Purple Top)SARS-COV-2 Vaccination 04/07/2019, 05/01/2019   Td 04/15/2017   Tdap 09/28/2006  Health Maintenance  Topic Date Due   COVID-19 Vaccine (3 - Booster for Pfizer series) 10/01/2019   Zoster Vaccines- Shingrix (1 of 2) Never done   INFLUENZA VACCINE  08/26/2020   COLONOSCOPY (Pts 45-69yr Insurance coverage will need to be confirmed)  10/12/2024   TETANUS/TDAP  04/16/2027   Hepatitis C Screening  Completed   HIV Screening  Completed   Pneumococcal Vaccine [id number] old  Aged Out   HPV VACCINES  Aged Out    Discussed health benefits of physical activity, and encouraged him to engage in regular exercise appropriate for his age and condition.  1. Annual physical exam He declined shingles vaccine today.  - Comprehensive metabolic panel - CBC - Lipid panel  2. Prostate cancer screening  - PSA Total (Reflex To Free) ([facility] only)  3. Hypogonadism in male  - CBC - Testosterone,Free and Total  4. Primary hypertension Well controlled.  Continue current medications.    5. Urinary hesitancy Counseled that this usually due to enlarged prostate and age related, but may be exacerbated by exogenous testosterone. Advised that if PSA and testosterone levels are normal we could try tamsulosin if this becomes more bothersome.        The entirety of the information documented in the History of Present Illness, Review of Systems and Physical Exam were personally obtained by me. Portions of this information were initially documented by the CMA and reviewed by me for thoroughness and accuracy.     DLelon Huh MD  [phone](phone) [phone](fax)  [place]

## 2020-09-11 LAB — COMPREHENSIVE METABOLIC PANEL
ALT: 31 IU/L (ref 0–44)
AST: 27 IU/L (ref 0–40)
Albumin/Globulin Ratio: 2.5 — ABNORMAL HIGH (ref 1.2–2.2)
Albumin: 4.7 g/dL (ref 4.0–5.0)
Alkaline Phosphatase: 51 IU/L (ref 44–121)
BUN/Creatinine Ratio: 19 (ref 9–20)
BUN: 18 mg/dL (ref 6–24)
Bilirubin Total: 0.3 mg/dL (ref 0.0–1.2)
CO2: 24 mmol/L (ref 20–29)
Calcium: 9.3 mg/dL (ref 8.7–10.2)
Chloride: 101 mmol/L (ref 96–106)
Creatinine, Ser: 0.97 mg/dL (ref 0.76–1.27)
Globulin, Total: 1.9 g/dL (ref 1.5–4.5)
Glucose: 85 mg/dL (ref 65–99)
Potassium: 4.6 mmol/L (ref 3.5–5.2)
Sodium: 139 mmol/L (ref 134–144)
Total Protein: 6.6 g/dL (ref 6.0–8.5)
eGFR: 95 mL/min/{1.73_m2} (ref >59)

## 2020-09-11 LAB — LIPID PANEL
Chol/HDL Ratio: 4.3 ratio (ref 0.0–5.0)
Cholesterol, Total: 217 mg/dL — ABNORMAL HIGH (ref 100–199)
HDL: 51 mg/dL (ref >39)
LDL Chol Calc (NIH): 147 mg/dL — ABNORMAL HIGH (ref 0–99)
Triglycerides: 108 mg/dL (ref 0–149)
VLDL Cholesterol Cal: 19 mg/dL (ref 5–40)

## 2020-09-11 LAB — CBC
Hematocrit: 42.2 % (ref 37.5–51.0)
Hemoglobin: 14.3 g/dL (ref 13.0–17.7)
MCH: 31.7 pg (ref 26.6–33.0)
MCHC: 33.9 g/dL (ref 31.5–35.7)
MCV: 94 fL (ref 79–97)
Platelets: 188 10*3/uL (ref 150–450)
RBC: 4.51 x10E6/uL (ref 4.14–5.80)
RDW: 12.7 % (ref 11.6–15.4)
WBC: 4 10*3/uL (ref 3.4–10.8)

## 2020-09-11 LAB — TESTOSTERONE,FREE AND TOTAL
Testosterone, Free: 19.5 pg/mL (ref 7.2–24.0)
Testosterone: 699 ng/dL (ref 264–916)

## 2020-09-11 LAB — PSA TOTAL (REFLEX TO FREE): Prostate Specific Ag, Serum: 0.7 ng/mL (ref 0.0–4.0)

## 2020-09-16 ENCOUNTER — Other Ambulatory Visit: Payer: Self-pay | Admitting: Family Medicine

## 2020-09-18 MED ORDER — TESTOSTERONE CYPIONATE 200 MG/ML IM SOLN: INTRAMUSCULAR | 1 refills | Status: DC

## 2020-09-18 NOTE — Addendum Note (Signed)
Addended by: Birdie Sons on: 09/18/2020 08:11 AM   Modules accepted: Orders

## 2020-10-20 ENCOUNTER — Other Ambulatory Visit: Payer: Self-pay | Admitting: Family Medicine

## 2020-11-28 ENCOUNTER — Other Ambulatory Visit: Payer: Self-pay | Admitting: Family Medicine

## 2020-11-28 NOTE — Telephone Encounter (Signed)
Requested Prescriptions  Pending Prescriptions Disp Refills  . hydrochlorothiazide (HYDRODIURIL) 25 MG tablet [Pharmacy Med Name: HYDROCHLOROTHIAZIDE 25 MG TAB] 30 tablet 8    Sig: TAKE 1 TABLET (25 MG TOTAL) BY MOUTH DAILY.     Cardiovascular: Diuretics - Thiazide Passed - 11/28/2020  2:02 AM      Passed - Ca in normal range and within 360 days    Calcium  Date Value Ref Range Status  09/06/2020 9.3 8.7 - 10.2 mg/dL Final         Passed - Cr in normal range and within 360 days    Creatinine, Ser  Date Value Ref Range Status  09/06/2020 0.97 0.76 - 1.27 mg/dL Final         Passed - K in normal range and within 360 days    Potassium  Date Value Ref Range Status  09/06/2020 4.6 3.5 - 5.2 mmol/L Final         Passed - Na in normal range and within 360 days    Sodium  Date Value Ref Range Status  09/06/2020 139 134 - 144 mmol/L Final         Passed - Last BP in normal range    BP Readings from Last 1 Encounters:  09/06/20 135/78         Passed - Valid encounter within last 6 months    Recent Outpatient Visits          2 months ago Annual physical exam   [facility] Birdie Sons, MD   6 months ago Essential hypertension   [facility] Birdie Sons, MD   7 months ago Essential hypertension   [place], Dionne Bucy, MD   1 year ago Annual physical exam   [facility] Carles Collet M, [place]   3 years ago Mass of right side of neck   [facility] Birdie Sons, MD

## 2021-01-01 DIAGNOSIS — Z961 Presence of intraocular lens: Secondary | ICD-10-CM | POA: Diagnosis not present

## 2021-01-15 NOTE — Telephone Encounter (Signed)
Last refill: 09/18/2020 qty 2ml, with 1 refill  Last office visit: 09/06/2020 Next office visit: no future visit scheduled.

## 2021-01-15 NOTE — Addendum Note (Signed)
Addended by: Randal Buba on: 01/15/2021 09:37 AM   Modules accepted: Orders

## 2021-01-16 MED ORDER — TESTOSTERONE CYPIONATE 200 MG/ML IM SOLN: INTRAMUSCULAR | 1 refills | Status: DC

## 2021-04-16 ENCOUNTER — Encounter: Payer: Self-pay | Admitting: Family Medicine

## 2021-04-16 DIAGNOSIS — E291 Testicular hypofunction: Secondary | ICD-10-CM

## 2021-04-16 MED ORDER — TESTOSTERONE CYPIONATE 200 MG/ML IM SOLN: INTRAMUSCULAR | 1 refills | Status: DC

## 2021-04-16 NOTE — Telephone Encounter (Signed)
Last refill: 01/16/2021 qty 51m, with 1 refill ? ?Last office visit: 09/06/2020 ?No future visit scheduled. ?

## 2021-04-22 ENCOUNTER — Other Ambulatory Visit: Payer: Self-pay

## 2021-04-22 DIAGNOSIS — E291 Testicular hypofunction: Secondary | ICD-10-CM

## 2021-04-22 MED ORDER — TESTOSTERONE CYPIONATE 200 MG/ML IM SOLN: INTRAMUSCULAR | 1 refills | Status: DC

## 2021-04-22 NOTE — Telephone Encounter (Signed)
Patient sent mychart message.  Stated he was unable to get Rx from [facility] and wants you to send a 63 Rx to [facility] ?

## 2021-07-01 ENCOUNTER — Other Ambulatory Visit: Payer: Self-pay | Admitting: Family Medicine

## 2021-09-04 ENCOUNTER — Other Ambulatory Visit (HOSPITAL_BASED_OUTPATIENT_CLINIC_OR_DEPARTMENT_OTHER): Payer: Self-pay

## 2021-10-01 ENCOUNTER — Other Ambulatory Visit: Payer: Self-pay | Admitting: Family Medicine

## 2021-11-06 ENCOUNTER — Encounter: Payer: Self-pay | Admitting: Family Medicine

## 2021-11-10 ENCOUNTER — Other Ambulatory Visit: Payer: Self-pay

## 2021-11-10 DIAGNOSIS — E291 Testicular hypofunction: Secondary | ICD-10-CM

## 2021-11-10 MED ORDER — TESTOSTERONE CYPIONATE 200 MG/ML IM SOLN: INTRAMUSCULAR | 1 refills | Status: DC

## 2022-01-02 DIAGNOSIS — H524 Presbyopia: Secondary | ICD-10-CM | POA: Diagnosis not present

## 2022-01-13 ENCOUNTER — Encounter: Payer: Self-pay | Admitting: Family Medicine

## 2022-01-14 ENCOUNTER — Other Ambulatory Visit: Payer: Self-pay | Admitting: *Deleted

## 2022-01-14 DIAGNOSIS — E291 Testicular hypofunction: Secondary | ICD-10-CM

## 2022-01-17 ENCOUNTER — Other Ambulatory Visit: Payer: Self-pay | Admitting: Family Medicine

## 2022-01-20 NOTE — Telephone Encounter (Signed)
Requested medication (s) are due for refill today: yes  Requested medication (s) are on the active medication list: yes    Last refill: 07/01/21  #90  1 refill  Future visit scheduled no  Notes to clinic:  Failed due to labs, please review. Thank you.  Requested Prescriptions  Pending Prescriptions Disp Refills   hydrochlorothiazide (HYDRODIURIL) 25 MG tablet [Pharmacy Med Name: HYDROCHLOROTHIAZIDE 25 MG TAB] 90 tablet 1    Sig: TAKE 1 TABLET (25 MG TOTAL) BY MOUTH DAILY.     Cardiovascular: Diuretics - Thiazide Failed - 01/17/2022  1:18 AM      Failed - Cr in normal range and within 180 days    Creatinine, Ser  Date Value Ref Range Status  09/06/2020 0.97 0.76 - 1.27 mg/dL Final         Failed - K in normal range and within 180 days    Potassium  Date Value Ref Range Status  09/06/2020 4.6 3.5 - 5.2 mmol/L Final         Failed - Na in normal range and within 180 days    Sodium  Date Value Ref Range Status  09/06/2020 139 134 - 144 mmol/L Final         Failed - Valid encounter within last 6 months    Recent Outpatient Visits           1 year ago Annual physical exam   [facility] Birdie Sons, MD   1 year ago Essential hypertension   [facility] Birdie Sons, MD   1 year ago Essential hypertension   [place], Dionne Bucy, MD   2 years ago Annual physical exam   [facility] Carles Collet M, [place]   4 years ago Mass of right side of neck   [facility] Birdie Sons, MD              Passed - Last BP in normal range    BP Readings from Last 1 Encounters:  09/06/20 135/78

## 2022-01-28 ENCOUNTER — Telehealth: Payer: Self-pay | Admitting: Family Medicine

## 2022-01-28 NOTE — Telephone Encounter (Signed)
Patient would like to have labs done before his physical on 03/11/22.  Please let him know if this can be done.

## 2022-01-29 NOTE — Telephone Encounter (Signed)
That's fine, just call or send a Mychart message about a week before his appointment and we'll place orders at that time.

## 2022-01-29 NOTE — Telephone Encounter (Signed)
Patient advised.

## 2022-02-15 ENCOUNTER — Other Ambulatory Visit: Payer: Self-pay | Admitting: Family Medicine

## 2022-02-16 NOTE — Telephone Encounter (Signed)
Patient has CPE scheduled, will refill medication.  Requested Prescriptions  Pending Prescriptions Disp Refills   hydrochlorothiazide (HYDRODIURIL) 25 MG tablet [Pharmacy Med Name: HYDROCHLOROTHIAZIDE 25 MG TAB] 90 tablet 0    Sig: TAKE 1 TABLET (25 MG TOTAL) BY MOUTH DAILY.     Cardiovascular: Diuretics - Thiazide Failed - 02/15/2022 10:32 AM      Failed - Cr in normal range and within 180 days    Creatinine, Ser  Date Value Ref Range Status  09/06/2020 0.97 0.76 - 1.27 mg/dL Final         Failed - K in normal range and within 180 days    Potassium  Date Value Ref Range Status  09/06/2020 4.6 3.5 - 5.2 mmol/L Final         Failed - Na in normal range and within 180 days    Sodium  Date Value Ref Range Status  09/06/2020 139 134 - 144 mmol/L Final         Failed - Valid encounter within last 6 months    Recent Outpatient Visits           1 year ago Annual physical exam   [place] Birdie Sons, MD   1 year ago Essential hypertension   [place], Donald E, MD   1 year ago Essential hypertension   [place], Dionne Bucy, MD   2 years ago Annual physical exam   [facility]  Car[facility] Collet M, [place]   4 years ago Mass of right side of neck   [place], MD       Future Appointments             In 3 weeks Caryn Section, Kirstie Peri, MD [facility]            Passed - Last BP in normal range    BP Readings from Last 1 Encounters:  09/06/20 135/78

## 2022-03-04 ENCOUNTER — Telehealth: Payer: Self-pay

## 2022-03-04 DIAGNOSIS — Z125 Encounter for screening for malignant neoplasm of prostate: Secondary | ICD-10-CM

## 2022-03-04 DIAGNOSIS — I10 Essential (primary) hypertension: Secondary | ICD-10-CM

## 2022-03-04 DIAGNOSIS — E291 Testicular hypofunction: Secondary | ICD-10-CM

## 2022-03-04 NOTE — Telephone Encounter (Signed)
Copied from [place]. Topic: General - Inquiry >> Mar 04, 2022  3:34 PM Ludger Nutting wrote: Patient would like to come in on 2/14 before his appointment at 940am to do is fasting labs. No lab orders in chart. Please follow up with patient after lab orders are entered.

## 2022-03-06 NOTE — Telephone Encounter (Signed)
Orders placed, needs to be fasting.

## 2022-03-06 NOTE — Addendum Note (Signed)
Addended by: Birdie Sons on: 03/06/2022 08:10 AM   Modules accepted: Orders

## 2022-03-10 NOTE — Progress Notes (Unsigned)
Isaac White,acting as a scribe for Lelon Huh, MD.,have documented all relevant documentation on the behalf of Lelon Huh, MD,as directed by  Lelon Huh, MD while in the presence of Lelon Huh, MD.    Complete physical exam   Patient: Isaac White   DOB: 31-May-1970   52 y.o. Male  MRN: [id number] Visit Date: 03/11/2022  Today's healthcare provider: Lelon Huh, MD   No chief complaint on file.  Subjective    Isaac White is a 52 y.o. male who presents today for a complete physical exam.  He reports consuming a {diet types:17450} diet. {Exercise:19826} He generally feels {well/fairly well/poorly:18703}. He reports sleeping {well/fairly well/poorly:18703}. He {does/does not:200015} have additional problems to discuss today.  HPI  ***  No past medical history on file. Past Surgical History:  Procedure Laterality Date   CATARACT EXTRACTION W/PHACO Right 04/23/2020   Procedure: CATARACT EXTRACTION PHACO AND INTRAOCULAR LENS PLACEMENT ([place]) RIGHT EYHANCE 2.78 00:22.4;  Surgeon: Birder Robson, MD;  Location: [place];  Service: Ophthalmology;  Laterality: Right;   CATARACT EXTRACTION W/PHACO Left 05/07/2020   Procedure: CATARACT EXTRACTION PHACO AND INTRAOCULAR LENS PLACEMENT ([facility]) LEFT EYHANCE TORIC, MALYUGIN 2.28 00:29.8;  Surgeon: Birder Robson, MD;  Location: [place];  Service: Ophthalmology;  Laterality: Left;   COLONOSCOPY WITH PROPOFOL N/A 10/13/2019   Procedure: COLONOSCOPY WITH PROPOFOL;  Surgeon: Jonathon Bellows, MD;  Location: [facility];  Service: Gastroenterology;  Laterality: N/A;   RETINAL DETACHMENT SURGERY     Social History   Socioeconomic History   Marital status: Single    Spouse name: Not on file   Number of children: Not on file   Years of education: Not on file   Highest education level: Not on file  Occupational History   Not on file  Tobacco Use   Smoking status: Never   Smokeless tobacco: Never   Substance and Sexual Activity   Alcohol use: Yes    Comment: occasionally   Drug use: Never   Sexual activity: Not on file  Other Topics Concern   Not on file  Social History Narrative   Not on file   Social Determinants of Health   Financial Resource Strain: Not on file  Food Insecurity: Not on file  Transportation Needs: Not on file  Physical Activity: Not on file  Stress: Not on file  Social Connections: Not on file  Intimate Partner Violence: Not on file   Family Status  Relation Name Status   Mother  Alive   Father Pradeep Keirsey Senior Alive   Sister  Alive   Brother  Alive   Family History  Problem Relation Age of Onset   Hyperlipidemia Father    Hypertension Father    Heart failure Father    COPD Father    Kidney disease Father    Atrial fibrillation Father    No Known Allergies  Patient Care Team: Birdie Sons, MD as PCP - General (Family Medicine)   Medications: Outpatient Medications Prior to Visit  Medication Sig   hydrochlorothiazide (HYDRODIURIL) 25 MG tablet TAKE 1 TABLET (25 MG TOTAL) BY MOUTH DAILY.   hydrOXYzine (ATARAX/VISTARIL) 10 MG tablet Take 1 tablet (10 mg total) by mouth 3 (three) times daily as needed.   testosterone cypionate (DEPOTESTOSTERONE CYPIONATE) 200 MG/ML injection INJECT 1 ML IN THE MUSCLE EVERY 14 DAY AS DIRECTED- DISCARD UNUSED PORTION IMMEDIATELY AFTER USE   valsartan (DIOVAN) 80 MG tablet TAKE 1 TABLET BY MOUTH EVERY DAY  No facility-administered medications prior to visit.    Review of Systems  Constitutional: Negative.   HENT: Negative.    Eyes: Negative.   Respiratory: Negative.    Cardiovascular: Negative.   Gastrointestinal: Negative.   Endocrine: Negative.   Genitourinary: Negative.   Musculoskeletal: Negative.   Skin: Negative.   Allergic/Immunologic: Negative.   Neurological: Negative.   Hematological: Negative.   Psychiatric/Behavioral: Negative.      {Labs  Heme  Chem  Endocrine  Serology   Results Review (optional):23779}  Objective    There were no vitals taken for this visit. {Show previous vital signs (optional):23777}   Physical Exam  ***  Last depression screening scores    09/06/2020    9:09 AM 09/06/2019   10:19 AM 04/15/2017   10:14 AM  PHQ 2/9 Scores  PHQ - 2 Score 0 0 0  PHQ- 9 Score   0   Last fall risk screening    09/06/2019   10:18 AM  Fall Risk   Falls in the past year? 0  Number falls in past yr: 0  Injury with Fall? 0  Follow up Falls evaluation completed   Last Audit-C alcohol use screening    09/06/2019   10:18 AM  Alcohol Use Disorder Test (AUDIT)  1. How often do you have a drink containing alcohol? 2  2. How many drinks containing alcohol do you have on a typical day when you are drinking? 1  3. How often do you have six or more drinks on one occasion? 2  AUDIT-C Score 5  4. How often during the last year have you found that you were not able to stop drinking once you had started? 0  5. How often during the last year have you failed to do what was normally expected from you because of drinking? 0  6. How often during the last year have you needed a first drink in the morning to get yourself going after a heavy drinking session? 0  7. How often during the last year have you had a feeling of guilt of remorse after drinking? 0  8. How often during the last year have you been unable to remember what happened the night before because you had been drinking? 0  9. Have you or someone else been injured as a result of your drinking? 0  10. Has a relative or friend or a doctor or another health worker been concerned about your drinking or suggested you cut down? 0  Alcohol Use Disorder Identification Test Final Score (AUDIT) 5  Alcohol Brief Interventions/Follow-up AUDIT Score <7 follow-up not indicated   A score of 3 or more in women, and 4 or more in men indicates increased risk for alcohol abuse, EXCEPT if all of the points are from question 1    No results found for any visits on 03/11/22.  Assessment & Plan    Routine Health Maintenance and Physical Exam  Exercise Activities and Dietary recommendations  Goals   None     Immunization History  Administered Date(s) Administered   Covid-19, Mrna,Vaccine(Spikevax)62yr and older 01/02/2022   Hepatitis A, Adult 04/15/2017, 10/25/2017   Hepatitis B, ADULT 04/15/2017, 10/25/2017   Hepatitis B, PED/ADOLESCENT 05/17/2017   Influenza Inj Mdck Quad Pf 01/02/2022   Influenza,inj,Quad PF,6+ Mos 10/25/2017, 12/09/2020   PFIZER(Purple Top)SARS-COV-2 Vaccination 04/07/2019, 05/01/2019   Pfizer Covid-19 Vaccine Bivalent Booster 124yr& up 12/09/2020   Td 04/15/2017   Tdap 09/28/2006    Health Maintenance  Topic Date Due   Zoster Vaccines- Shingrix (1 of 2) Never done   COLONOSCOPY (Pts 45-33yr Insurance coverage will need to be confirmed)  10/12/2024   DTaP/Tdap/Td (3 - Td or Tdap) 04/16/2027   INFLUENZA VACCINE  Completed   COVID-19 Vaccine  Completed   Hepatitis C Screening  Completed   HIV Screening  Completed   HPV VACCINES  Aged Out    Discussed health benefits of physical activity, and encouraged him to engage in regular exercise appropriate for his age and condition.  ***  No follow-ups on file.     {provider attestation***:1}   DLelon Huh MD  [phone](phone) [phone](fax)  [place]

## 2022-03-11 ENCOUNTER — Ambulatory Visit (INDEPENDENT_AMBULATORY_CARE_PROVIDER_SITE_OTHER): Payer: BC Managed Care – PPO | Admitting: Family Medicine

## 2022-03-11 VITALS — BP 130/99 | HR 71 | Temp 98.3°F | Ht 70.0 in | Wt 201.0 lb

## 2022-03-11 DIAGNOSIS — Z125 Encounter for screening for malignant neoplasm of prostate: Secondary | ICD-10-CM | POA: Diagnosis not present

## 2022-03-11 DIAGNOSIS — Z0000 Encounter for general adult medical examination without abnormal findings: Secondary | ICD-10-CM

## 2022-03-11 DIAGNOSIS — E291 Testicular hypofunction: Secondary | ICD-10-CM | POA: Diagnosis not present

## 2022-03-11 DIAGNOSIS — Z8249 Family history of ischemic heart disease and other diseases of the circulatory system: Secondary | ICD-10-CM

## 2022-03-11 DIAGNOSIS — I10 Essential (primary) hypertension: Secondary | ICD-10-CM

## 2022-03-13 ENCOUNTER — Ambulatory Visit
Admission: RE | Admit: 2022-03-13 | Discharge: 2022-03-13 | Disposition: A | Discharge status: Home / Self Care | Payer: BC Managed Care – PPO | Source: Ambulatory Visit | Attending: Family Medicine | Admitting: Family Medicine

## 2022-03-13 DIAGNOSIS — Z8249 Family history of ischemic heart disease and other diseases of the circulatory system: Secondary | ICD-10-CM | POA: Insufficient documentation

## 2022-03-13 DIAGNOSIS — I10 Essential (primary) hypertension: Secondary | ICD-10-CM | POA: Insufficient documentation

## 2022-03-17 ENCOUNTER — Other Ambulatory Visit: Payer: Self-pay | Admitting: Family Medicine

## 2022-03-17 ENCOUNTER — Encounter: Payer: Self-pay | Admitting: Family Medicine

## 2022-03-17 DIAGNOSIS — I10 Essential (primary) hypertension: Secondary | ICD-10-CM

## 2022-03-17 DIAGNOSIS — E291 Testicular hypofunction: Secondary | ICD-10-CM

## 2022-03-17 DIAGNOSIS — E7800 Pure hypercholesterolemia, unspecified: Secondary | ICD-10-CM

## 2022-03-17 MED ORDER — VALSARTAN 160 MG PO TABS: 160.0000 mg | ORAL_TABLET | Freq: Every day | ORAL | 1 refills | Status: DC

## 2022-03-17 MED ORDER — ROSUVASTATIN CALCIUM 10 MG PO TABS: 10.0000 mg | ORAL_TABLET | Freq: Every day | ORAL | 3 refills | Status: DC

## 2022-03-19 ENCOUNTER — Other Ambulatory Visit: Payer: Self-pay | Admitting: Family Medicine

## 2022-03-19 DIAGNOSIS — E291 Testicular hypofunction: Secondary | ICD-10-CM

## 2022-03-19 LAB — CBC
Hematocrit: 44.2 % (ref 37.5–51.0)
Hemoglobin: 15 g/dL (ref 13.0–17.7)
MCH: 31.2 pg (ref 26.6–33.0)
MCHC: 33.9 g/dL (ref 31.5–35.7)
MCV: 92 fL (ref 79–97)
Platelets: 192 10*3/uL (ref 150–450)
RBC: 4.81 x10E6/uL (ref 4.14–5.80)
RDW: 11.9 % (ref 11.6–15.4)
WBC: 5 10*3/uL (ref 3.4–10.8)

## 2022-03-19 LAB — COMPREHENSIVE METABOLIC PANEL
ALT: 22 IU/L (ref 0–44)
AST: 15 IU/L (ref 0–40)
Albumin/Globulin Ratio: 2.2 (ref 1.2–2.2)
Albumin: 4.6 g/dL (ref 3.8–4.9)
Alkaline Phosphatase: 50 IU/L (ref 44–121)
BUN/Creatinine Ratio: 14 (ref 9–20)
BUN: 15 mg/dL (ref 6–24)
Bilirubin Total: 0.5 mg/dL (ref 0.0–1.2)
CO2: 23 mmol/L (ref 20–29)
Calcium: 9.7 mg/dL (ref 8.7–10.2)
Chloride: 100 mmol/L (ref 96–106)
Creatinine, Ser: 1.07 mg/dL (ref 0.76–1.27)
Globulin, Total: 2.1 g/dL (ref 1.5–4.5)
Glucose: 101 mg/dL — ABNORMAL HIGH (ref 70–99)
Potassium: 4 mmol/L (ref 3.5–5.2)
Sodium: 141 mmol/L (ref 134–144)
Total Protein: 6.7 g/dL (ref 6.0–8.5)
eGFR: 84 mL/min/{1.73_m2} (ref >59)

## 2022-03-19 LAB — TESTOSTERONE,FREE AND TOTAL
Testosterone, Free: 26.3 pg/mL — ABNORMAL HIGH (ref 7.2–24.0)
Testosterone: 1123 ng/dL — ABNORMAL HIGH (ref 264–916)

## 2022-03-19 LAB — LIPID PANEL
Chol/HDL Ratio: 4.1 ratio (ref 0.0–5.0)
Cholesterol, Total: 208 mg/dL — ABNORMAL HIGH (ref 100–199)
HDL: 51 mg/dL (ref >39)
LDL Chol Calc (NIH): 122 mg/dL — ABNORMAL HIGH (ref 0–99)
Triglycerides: 202 mg/dL — ABNORMAL HIGH (ref 0–149)
VLDL Cholesterol Cal: 35 mg/dL (ref 5–40)

## 2022-03-19 LAB — PSA: Prostate Specific Ag, Serum: 1 ng/mL (ref 0.0–4.0)

## 2022-03-19 MED ORDER — TESTOSTERONE CYPIONATE 100 MG/ML IM SOLN: 100.0000 mg | INTRAMUSCULAR | 0 refills | Status: DC

## 2022-05-24 ENCOUNTER — Other Ambulatory Visit: Payer: Self-pay | Admitting: Family Medicine

## 2022-06-12 ENCOUNTER — Encounter: Payer: Self-pay | Admitting: Family Medicine

## 2022-06-12 DIAGNOSIS — E7800 Pure hypercholesterolemia, unspecified: Secondary | ICD-10-CM

## 2022-08-31 ENCOUNTER — Other Ambulatory Visit: Payer: Self-pay | Admitting: Family Medicine

## 2022-09-01 NOTE — Telephone Encounter (Signed)
Requested Prescriptions  Pending Prescriptions Disp Refills   hydrochlorothiazide (HYDRODIURIL) 25 MG tablet [Pharmacy Med Name: HYDROCHLOROTHIAZIDE 25 MG TAB] 90 tablet 0    Sig: TAKE 1 TABLET (25 MG TOTAL) BY MOUTH DAILY.     Cardiovascular: Diuretics - Thiazide Failed - 08/31/2022  2:44 AM      Failed - Last BP in normal range    BP Readings from Last 1 Encounters:  03/11/22 (!) 130/99         Passed - Cr in normal range and within 180 days    Creatinine, Ser  Date Value Ref Range Status  03/11/2022 1.07 0.76 - 1.27 mg/dL Final         Passed - K in normal range and within 180 days    Potassium  Date Value Ref Range Status  03/11/2022 4.0 3.5 - 5.2 mmol/L Final         Passed - Na in normal range and within 180 days    Sodium  Date Value Ref Range Status  03/11/2022 141 134 - 144 mmol/L Final         Passed - Valid encounter within last 6 months    Recent Outpatient Visits           5 months ago Annual physical exam   [facility] Malva Limes, MD   1 year ago Annual physical exam   [facility] Malva Limes, MD   2 years ago Essential hypertension   [place] [facility] Malva Limes, MD   2 years ago Essential hypertension   [facility] [place], Marzella Schlein, MD   2 years ago Annual physical exam   [facility] [place], Ricki Rodriguez [place], [place]

## 2022-09-09 ENCOUNTER — Other Ambulatory Visit: Payer: Self-pay | Admitting: Family Medicine

## 2022-09-09 DIAGNOSIS — I10 Essential (primary) hypertension: Secondary | ICD-10-CM

## 2022-10-02 ENCOUNTER — Encounter: Payer: Self-pay | Admitting: Family Medicine

## 2022-10-02 ENCOUNTER — Ambulatory Visit: Payer: BC Managed Care – PPO | Admitting: Family Medicine

## 2022-10-02 VITALS — BP 127/73 | HR 70 | Wt 192.6 lb

## 2022-10-02 DIAGNOSIS — E291 Testicular hypofunction: Secondary | ICD-10-CM

## 2022-10-02 DIAGNOSIS — Z23 Encounter for immunization: Secondary | ICD-10-CM | POA: Diagnosis not present

## 2022-10-02 DIAGNOSIS — E7800 Pure hypercholesterolemia, unspecified: Secondary | ICD-10-CM

## 2022-10-02 DIAGNOSIS — I10 Essential (primary) hypertension: Secondary | ICD-10-CM | POA: Diagnosis not present

## 2022-10-02 NOTE — Progress Notes (Signed)
Established patient visit   Patient: Isaac White   DOB: May 12, 1970   52 y.o. Male  MRN: [id number] Visit Date: 10/02/2022  Today's healthcare provider: Mila Merry, MD   Chief Complaint  Patient presents with   Medical Management of Chronic Issues    Hypertension and testosterone follow-up.    Subjective    Discussed the use of AI scribe software for clinical note transcription with the patient, who gave verbal consent to proceed.  History of Present Illness   The patient, with a history of hypertension, hyperlipidemia, and hypogonadism, presents for a routine follow-up visit. He reports difficulty maintaining sleep, often waking up at 3 AM after falling asleep around 10 PM. Despite this, he does not feel fatigued during the day and occasionally uses over-the-counter sleep aids to help him fall asleep. He denies any changes in his diet, which he describes as generally healthy, and consumes coffee twice daily.  Regarding his medications, he has been compliant with his regimen, which includes testosterone injections 1 ml every two weeks. He reports no issues with his current medications. He denies any chest pain, palpitations, shortness of breath, or swelling in his extremities.  The patient also expresses curiosity about his cholesterol levels, as he started rosuvastatin several months ago following a elevated coronary calcium score. He is taking rosuvastatin consistently with no apparent side effects.       Medications: Outpatient Medications Prior to Visit  Medication Sig   hydrochlorothiazide (HYDRODIURIL) 25 MG tablet TAKE 1 TABLET (25 MG TOTAL) BY MOUTH DAILY.   hydrOXYzine (ATARAX/VISTARIL) 10 MG tablet Take 1 tablet (10 mg total) by mouth 3 (three) times daily as needed.   rosuvastatin (CRESTOR) 10 MG tablet Take 1 tablet (10 mg total) by mouth daily.   testosterone cypionate (DEPOTESTOTERONE CYPIONATE) 100 MG/ML injection Inject 1 mL (100 mg total) into the muscle  every 14 (fourteen) days. For IM use only   valsartan (DIOVAN) 160 MG tablet TAKE 1 TABLET BY MOUTH EVERY DAY   No facility-administered medications prior to visit.   Review of Systems  Constitutional:  Negative for appetite change, chills and fever.  Respiratory:  Negative for chest tightness, shortness of breath and wheezing.   Cardiovascular:  Negative for chest pain and palpitations.  Gastrointestinal:  Negative for abdominal pain, nausea and vomiting.       Objective    BP 127/73 (BP Location: Left Arm, Patient Position: Sitting, Cuff Size: Normal)   Pulse 70   Wt 192 lb 9.6 oz (87.4 kg)   SpO2 100%   BMI 27.64 kg/m    Physical Exam   CHEST: lungs clear to auscultation CARDIOVASCULAR: heart sounds normal    No results found for any visits on 10/02/22.  Assessment & Plan        Insomnia Difficulty maintaining sleep, waking up early in the morning. No daytime fatigue or sleepiness. Currently using over-the-counter gummies for sleep aid occasionally. -Continue current management as patient feels refreshed and not tired during the day.  Testosterone Replacement Therapy On testosterone injections every two weeks, last injection approximately two weeks ago. No reported side effects. -Continue current regimen. check testosterone levels today, has been two weeks since last injection.   Hyperlipidemia On rosuvastatin for approximately three to four months. Patient inquired about repeat imaging for cholesterol. -Complete blood work including cholesterol levels.    No follow-ups on file.      Mila Merry, MD  [facility] [phone]  phone) [phone] (fax)  [facility]

## 2022-10-06 LAB — RENAL FUNCTION PANEL
Albumin: 4.8 g/dL (ref 3.8–4.9)
BUN/Creatinine Ratio: 16 (ref 9–20)
BUN: 17 mg/dL (ref 6–24)
CO2: 26 mmol/L (ref 20–29)
Calcium: 10.2 mg/dL (ref 8.7–10.2)
Chloride: 101 mmol/L (ref 96–106)
Creatinine, Ser: 1.08 mg/dL (ref 0.76–1.27)
Glucose: 98 mg/dL (ref 70–99)
Phosphorus: 3.4 mg/dL (ref 2.8–4.1)
Potassium: 4.9 mmol/L (ref 3.5–5.2)
Sodium: 140 mmol/L (ref 134–144)
eGFR: 83 mL/min/{1.73_m2} (ref >59)

## 2022-10-06 LAB — CBC
Hematocrit: 44.1 % (ref 37.5–51.0)
Hemoglobin: 14.8 g/dL (ref 13.0–17.7)
MCH: 32.1 pg (ref 26.6–33.0)
MCHC: 33.6 g/dL (ref 31.5–35.7)
MCV: 96 fL (ref 79–97)
Platelets: 174 10*3/uL (ref 150–450)
RBC: 4.61 x10E6/uL (ref 4.14–5.80)
RDW: 11.6 % (ref 11.6–15.4)
WBC: 4.5 10*3/uL (ref 3.4–10.8)

## 2022-10-06 LAB — LIPID PANEL
Chol/HDL Ratio: 2.6 ratio (ref 0.0–5.0)
Cholesterol, Total: 185 mg/dL (ref 100–199)
HDL: 71 mg/dL (ref >39)
LDL Chol Calc (NIH): 90 mg/dL (ref 0–99)
Triglycerides: 137 mg/dL (ref 0–149)
VLDL Cholesterol Cal: 24 mg/dL (ref 5–40)

## 2022-10-06 LAB — TESTOSTERONE,FREE AND TOTAL
Testosterone, Free: 6.2 pg/mL — ABNORMAL LOW (ref 7.2–24.0)
Testosterone: 240 ng/dL — ABNORMAL LOW (ref 264–916)

## 2022-11-12 ENCOUNTER — Encounter: Payer: Self-pay | Admitting: Family Medicine

## 2022-11-12 DIAGNOSIS — E291 Testicular hypofunction: Secondary | ICD-10-CM

## 2022-11-16 MED ORDER — TESTOSTERONE CYPIONATE 100 MG/ML IM SOLN
150.0000 mg | INTRAMUSCULAR | 1 refills | Status: DC
Start: 2022-11-16 — End: 2023-03-04

## 2022-11-18 DIAGNOSIS — Z0100 Encounter for examination of eyes and vision without abnormal findings: Secondary | ICD-10-CM | POA: Diagnosis not present

## 2022-11-18 DIAGNOSIS — H26492 Other secondary cataract, left eye: Secondary | ICD-10-CM | POA: Diagnosis not present

## 2022-12-06 ENCOUNTER — Other Ambulatory Visit: Payer: Self-pay | Admitting: Family Medicine

## 2022-12-07 NOTE — Telephone Encounter (Signed)
Requested by interface surescripts.  Requested Prescriptions  Pending Prescriptions Disp Refills   hydrochlorothiazide (HYDRODIURIL) 25 MG tablet [Pharmacy Med Name: HYDROCHLOROTHIAZIDE 25 MG TAB] 90 tablet 0    Sig: TAKE 1 TABLET (25 MG TOTAL) BY MOUTH DAILY.     Cardiovascular: Diuretics - Thiazide Passed - 12/06/2022  2:09 AM      Passed - Cr in normal range and within 180 days    Creatinine, Ser  Date Value Ref Range Status  10/02/2022 1.08 0.76 - 1.27 mg/dL Final         Passed - K in normal range and within 180 days    Potassium  Date Value Ref Range Status  10/02/2022 4.9 3.5 - 5.2 mmol/L Final         Passed - Na in normal range and within 180 days    Sodium  Date Value Ref Range Status  10/02/2022 140 134 - 144 mmol/L Final         Passed - Last BP in normal range    BP Readings from Last 1 Encounters:  10/02/22 127/73         Passed - Valid encounter within last 6 months    Recent Outpatient Visits           2 months ago Primary hypertension   [facility] Malva Limes, MD   9 months ago Annual physical exam   [facility] Malva Limes, MD   2 years ago Annual physical exam   [facility] Malva Limes, MD   2 years ago Essential hypertension   [place] [facility] Malva Limes, MD   2 years ago Essential hypertension    [facility] [place], Marzella Schlein, MD

## 2022-12-14 DIAGNOSIS — Z9889 Other specified postprocedural states: Secondary | ICD-10-CM | POA: Diagnosis not present

## 2022-12-14 DIAGNOSIS — H26491 Other secondary cataract, right eye: Secondary | ICD-10-CM | POA: Diagnosis not present

## 2023-02-08 ENCOUNTER — Encounter: Payer: Self-pay | Admitting: Family Medicine

## 2023-03-01 ENCOUNTER — Ambulatory Visit: Payer: 59 | Admitting: Family Medicine

## 2023-03-01 VITALS — BP 132/82 | HR 78 | Temp 98.4°F | Ht 70.0 in | Wt 204.0 lb

## 2023-03-01 DIAGNOSIS — J300 Vasomotor rhinitis: Secondary | ICD-10-CM | POA: Diagnosis not present

## 2023-03-01 DIAGNOSIS — R6882 Decreased libido: Secondary | ICD-10-CM

## 2023-03-01 DIAGNOSIS — E291 Testicular hypofunction: Secondary | ICD-10-CM | POA: Diagnosis not present

## 2023-03-01 DIAGNOSIS — N489 Disorder of penis, unspecified: Secondary | ICD-10-CM | POA: Diagnosis not present

## 2023-03-01 MED ORDER — VALACYCLOVIR HCL 1 G PO TABS: 1000.0000 mg | ORAL_TABLET | Freq: Two times a day (BID) | ORAL | 0 refills | Status: AC

## 2023-03-01 MED ORDER — IPRATROPIUM BROMIDE 0.06 % NA SOLN
2.0000 | Freq: Two times a day (BID) | NASAL | 3 refills | Status: DC
  Filled 2023-04-20: qty 15, 38d supply, fill #0

## 2023-03-01 NOTE — Progress Notes (Signed)
Established patient visit   Patient: Isaac White   DOB: 09/03/1970   52 y.o. Male  MRN: [id number] Visit Date: 03/01/2023  Today's healthcare provider: Mila Merry, MD   Chief Complaint  Patient presents with   Sinus drainage    Patient complains of post nasal drainage for 4 months.  He denies other sinus symptoms such as runny nose, sinus pain etc.  He has used allergy treatment for 1 months with no relief.     Subjective    Discussed the use of AI scribe software for clinical note transcription with the patient, who gave verbal consent to proceed.  History of Present Illness   He is a 53 year old male who presents with sinus drainage and throat clearing. He has been experiencing sinus drainage that feels like it is in the back of his throat, necessitating frequent throat clearing for about four months. The drainage does not come out of the nose, and he does not need to blow his nose frequently. Symptoms are worse in the mornings but persist throughout the day. He has tried taking Zyrtec daily for a month without any improvement. No associated cough, chest involvement, or sore throat. He occasionally experiences heartburn once every couple of weeks. He has no history of sinus surgeries and generally does not have trouble with allergies, except when exposed to leaf blowing, which requires him to wear a face mask. No new pets have been introduced into his home environment.  He has a single clear bump on his penis that has been present for about a year without pain or change which he states looks like a cold sore.    He also has a history of low testosterone levels and is receiving testosterone injections every two weeks, with the current dose being 1.5 mL. Despite treatment, he reports a low sex drive. The last testosterone level check in September was low.       Medications: Outpatient Medications Prior to Visit  Medication Sig   hydrochlorothiazide (HYDRODIURIL) 25 MG tablet  TAKE 1 TABLET (25 MG TOTAL) BY MOUTH DAILY.   hydrOXYzine (ATARAX/VISTARIL) 10 MG tablet Take 1 tablet (10 mg total) by mouth 3 (three) times daily as needed.   rosuvastatin (CRESTOR) 10 MG tablet Take 1 tablet (10 mg total) by mouth daily.   testosterone cypionate (DEPOTESTOTERONE CYPIONATE) 100 MG/ML injection Inject 1.5 mLs (150 mg total) into the muscle every 14 (fourteen) days. For IM use only   valsartan (DIOVAN) 160 MG tablet TAKE 1 TABLET BY MOUTH EVERY DAY   No facility-administered medications prior to visit.   Review of Systems  Constitutional:  Negative for appetite change, chills and fever.  Respiratory:  Negative for chest tightness, shortness of breath and wheezing.   Cardiovascular:  Negative for chest pain and palpitations.  Gastrointestinal:  Negative for abdominal pain, nausea and vomiting.      Objective    BP 132/82 (BP Location: Left Arm, Patient Position: Sitting, Cuff Size: Normal)   Pulse 78   Temp 98.4 F (36.9 C) (Oral)   Ht 5\' 10"  (1.778 m)   Wt 204 lb (92.5 kg)   SpO2 100%   BMI 29.27 kg/m   Physical Exam   HEENT: Nasal mucosa pale and congested. Posterior pharynx with mild drainage. SKIN: Small, clear, non-tender bump on penis.    No results found for any visits on 03/01/23.  Assessment & Plan       Postnasal Drip Chronic postnasal  drip for the past four months, worse in the mornings. No associated cough or nocturnal symptoms. Failed trial of Zyrtec. Nasal examination shows congestion and paleness, suggestive of vasomotor rhinitis rather than allergic rhinitis. -Prescribe Atrovent nasal spray. -Let me know in 2-3 weeks no improvement, consider steroid nasal spray.  Genital Lesion Chronic clear genital lesion present for about a year. History of oral HSV. -Prescribe Valtrex and evaluate response in a week. -If no improvement, consider referral to dermatology for evaluation of possible genital wart.  Hypogonadism On testosterone replacement  therapy but reports low sex drive. Last testosterone levels checked in September were low. -Check testosterone levels today. -If levels are still low, consider adjusting testosterone dose. -If levels are normal, consider referral to endocrinology for further evaluation.    No follow-ups on file.      Mila Merry, MD  [facility] [phone] (phone) [phone] (fax)  [facility]

## 2023-03-02 DIAGNOSIS — Z125 Encounter for screening for malignant neoplasm of prostate: Secondary | ICD-10-CM | POA: Diagnosis not present

## 2023-03-02 DIAGNOSIS — E291 Testicular hypofunction: Secondary | ICD-10-CM | POA: Diagnosis not present

## 2023-03-02 DIAGNOSIS — Z1329 Encounter for screening for other suspected endocrine disorder: Secondary | ICD-10-CM | POA: Diagnosis not present

## 2023-03-04 ENCOUNTER — Encounter: Payer: Self-pay | Admitting: Family Medicine

## 2023-03-04 DIAGNOSIS — E291 Testicular hypofunction: Secondary | ICD-10-CM

## 2023-03-04 LAB — TESTOSTERONE,FREE AND TOTAL
Testosterone, Free: 7.4 pg/mL (ref 7.2–24.0)
Testosterone: 171 ng/dL — ABNORMAL LOW (ref 264–916)

## 2023-03-04 MED ORDER — TESTOSTERONE CYPIONATE 100 MG/ML IM SOLN
200.0000 mg | INTRAMUSCULAR | 1 refills | Status: DC
Start: 2023-03-04 — End: 2023-07-20

## 2023-03-12 ENCOUNTER — Other Ambulatory Visit: Payer: Self-pay | Admitting: Family Medicine

## 2023-03-12 DIAGNOSIS — I10 Essential (primary) hypertension: Secondary | ICD-10-CM

## 2023-03-12 DIAGNOSIS — E7800 Pure hypercholesterolemia, unspecified: Secondary | ICD-10-CM

## 2023-03-15 DIAGNOSIS — Z6829 Body mass index (BMI) 29.0-29.9, adult: Secondary | ICD-10-CM | POA: Diagnosis not present

## 2023-03-15 DIAGNOSIS — G479 Sleep disorder, unspecified: Secondary | ICD-10-CM | POA: Diagnosis not present

## 2023-03-15 DIAGNOSIS — R6882 Decreased libido: Secondary | ICD-10-CM | POA: Diagnosis not present

## 2023-03-15 DIAGNOSIS — E291 Testicular hypofunction: Secondary | ICD-10-CM | POA: Diagnosis not present

## 2023-04-13 ENCOUNTER — Encounter: Payer: Self-pay | Admitting: Family Medicine

## 2023-04-13 ENCOUNTER — Other Ambulatory Visit: Payer: Self-pay | Admitting: Family Medicine

## 2023-04-13 DIAGNOSIS — E291 Testicular hypofunction: Secondary | ICD-10-CM

## 2023-04-17 ENCOUNTER — Other Ambulatory Visit: Payer: Self-pay | Admitting: Family Medicine

## 2023-04-17 ENCOUNTER — Encounter: Payer: Self-pay | Admitting: Family Medicine

## 2023-04-20 ENCOUNTER — Other Ambulatory Visit (HOSPITAL_BASED_OUTPATIENT_CLINIC_OR_DEPARTMENT_OTHER): Payer: Self-pay

## 2023-04-20 ENCOUNTER — Other Ambulatory Visit: Payer: Self-pay

## 2023-04-20 MED ORDER — HYDROXYZINE HCL 10 MG PO TABS
10.0000 mg | ORAL_TABLET | Freq: Three times a day (TID) | ORAL | 0 refills | Status: AC | PRN
  Filled 2023-04-20 – 2023-05-10 (×2): qty 30, 10d supply, fill #0

## 2023-04-20 MED FILL — Hydrochlorothiazide Tab 25 MG: ORAL | 60 days supply | Qty: 60 | Fill #0 | Status: AC

## 2023-04-20 MED FILL — Valsartan Tab 160 MG: ORAL | 90 days supply | Qty: 90 | Fill #0 | Status: AC

## 2023-04-20 MED FILL — Rosuvastatin Calcium Tab 10 MG: ORAL | 90 days supply | Qty: 90 | Fill #0 | Status: AC

## 2023-04-21 DIAGNOSIS — E291 Testicular hypofunction: Secondary | ICD-10-CM | POA: Diagnosis not present

## 2023-04-26 ENCOUNTER — Encounter: Payer: Self-pay | Admitting: Family Medicine

## 2023-04-26 LAB — HEMOGLOBIN AND HEMATOCRIT, BLOOD
Hematocrit: 49.2 % (ref 37.5–51.0)
Hemoglobin: 16.4 g/dL (ref 13.0–17.7)

## 2023-04-26 LAB — TESTOSTERONE,FREE AND TOTAL
Testosterone, Free: 25.6 pg/mL — ABNORMAL HIGH (ref 7.2–24.0)
Testosterone: 1343 ng/dL — ABNORMAL HIGH (ref 264–916)

## 2023-05-10 ENCOUNTER — Other Ambulatory Visit: Payer: Self-pay

## 2023-06-12 ENCOUNTER — Other Ambulatory Visit: Payer: Self-pay | Admitting: Family Medicine

## 2023-06-14 ENCOUNTER — Other Ambulatory Visit: Payer: Self-pay

## 2023-06-14 MED ORDER — HYDROCHLOROTHIAZIDE 25 MG PO TABS
25.0000 mg | ORAL_TABLET | Freq: Every day | ORAL | 3 refills | Status: AC
  Filled 2023-06-14: qty 90, 90d supply, fill #0
  Filled 2023-09-12: qty 90, 90d supply, fill #1
  Filled 2023-12-11: qty 90, 90d supply, fill #2
  Filled 2024-02-25: qty 90, 90d supply, fill #0

## 2023-06-23 ENCOUNTER — Other Ambulatory Visit: Payer: Self-pay

## 2023-06-23 ENCOUNTER — Emergency Department

## 2023-06-23 ENCOUNTER — Emergency Department
Admission: EM | Admit: 2023-06-23 | Discharge: 2023-06-23 | Disposition: A | Discharge status: Home / Self Care | Attending: Emergency Medicine | Admitting: Emergency Medicine

## 2023-06-23 ENCOUNTER — Ambulatory Visit: Payer: Self-pay

## 2023-06-23 DIAGNOSIS — S61411A Laceration without foreign body of right hand, initial encounter: Secondary | ICD-10-CM | POA: Insufficient documentation

## 2023-06-23 DIAGNOSIS — S6991XA Unspecified injury of right wrist, hand and finger(s), initial encounter: Secondary | ICD-10-CM | POA: Diagnosis present

## 2023-06-23 DIAGNOSIS — W228XXA Striking against or struck by other objects, initial encounter: Secondary | ICD-10-CM | POA: Insufficient documentation

## 2023-06-23 MED ORDER — LIDOCAINE HCL (PF) 1 % IJ SOLN
5.0000 mL | Freq: Once | INTRAMUSCULAR | Status: AC
  Administered 2023-06-23: 5 mL
  Filled 2023-06-23: qty 5

## 2023-06-23 MED ORDER — CEPHALEXIN 500 MG PO CAPS
500.0000 mg | ORAL_CAPSULE | Freq: Two times a day (BID) | ORAL | 0 refills | Status: AC
  Filled 2023-06-23: qty 14, 7d supply, fill #0

## 2023-06-23 MED ORDER — LIDOCAINE-EPINEPHRINE-TETRACAINE (LET) TOPICAL GEL
3.0000 mL | Freq: Once | TOPICAL | Status: AC
  Administered 2023-06-23: 3 mL via TOPICAL
  Filled 2023-06-23: qty 3

## 2023-06-23 NOTE — ED Triage Notes (Signed)
 Pt to ED via [facility] from work. Pt reports cut top of right hand on steel at work. Pt reports can see tendon. Pt has about 2in lac.

## 2023-06-23 NOTE — Discharge Instructions (Signed)
 You were evaluated in the ED for a laceration to your right hand.  Your x-rays are normal.  There was no tendon injury.  There were 4 sutures placed and will need to be removed in 7 to 10 days.  You can either return to this ED or follow-up with your primary care or urgent care for suture removal.  Your tetanus is up-to-date.  The due date is in 2029.  Please review laceration care patient education instructions attached to your discharge paper.  Monitor for signs of infection.  You have been prescribed antibiotics, please take until dose is complete.

## 2023-06-23 NOTE — ED Provider Notes (Signed)
 [facility] Emergency Department Provider Note     Event Date/Time   First MD Initiated Contact with Patient 06/23/23 1601     (approximate)   History   Laceration   HPI  Isaac White is a 53 y.o. male with no significant past medical history presents to the ED for evaluation of a laceration to the top of his right hand.  Patient was trying to unscrew a bolt and hit his hand on a metal piece.  He reports he can visualize the tendon.  Denies decrease of movement and numbness.  Bleeding is controlled. Tetanus up to date.     Physical Exam   Triage Vital Signs: ED Triage Vitals [06/23/23 1531]  Encounter Vitals Group     BP (!) 146/88     Systolic BP Percentile      Diastolic BP Percentile      Pulse Rate 80     Resp 20     Temp 98.3 F (36.8 C)     Temp Source Oral     SpO2 98 %     Weight      Height      Head Circumference      Peak Flow      Pain Score 3     Pain Loc      Pain Education      Exclude from Growth Chart     Most recent vital signs: Vitals:   06/23/23 1531  BP: (!) 146/88  Pulse: 80  Resp: 20  Temp: 98.3 F (36.8 C)  SpO2: 98%    General Awake, no distress.  HEENT NCAT.  CV:  Good peripheral perfusion.  RESP:  Normal effort.  ABD:  No distention.  Other:  Right hand reveals a 2 cm laceration over the distal second metacarpal on the dorsal aspect.  Tendon is visualized however appears intact.  Normal flexor and extensor strength.  Neurovascular status intact all throughout.  Good capillary refill.  F ROM of all digits.   ED Results / Procedures / Treatments   Labs (all labs ordered are listed, but only abnormal results are displayed) Labs Reviewed - No data to display  RADIOLOGY  I personally viewed and evaluated these images as part of my medical decision making, as well as reviewing the written report by the radiologist.  ED Provider Interpretation: No Bony abnormality noted on Right hand xray  No  results found.  PROCEDURES:  Critical Care performed: No  .Laceration Repair  Date/Time: 06/23/2023 5:42 PM  Performed by: Alvaro Augusta A, PA-C Authorized by: Alvaro Augusta A, PA-C   Consent:    Consent obtained:  Verbal   Consent given by:  Patient   Risks discussed:  Infection, pain, tendon damage, nerve damage, poor wound healing and need for additional repair Universal protocol:    Patient identity confirmed:  Verbally with patient Laceration details:    Location:  Hand   Hand location:  R hand, dorsum   Length (cm):  2   Depth (mm):  1 Exploration:    Hemostasis achieved with:  Direct pressure Treatment:    Area cleansed with:  Povidone-iodine, saline and chlorhexidine   Amount of cleaning:  Extensive   Irrigation solution:  Sterile saline   Irrigation method:  Pressure wash Skin repair:    Repair method:  Sutures   Suture size:  5-0   Suture material:  Nylon   Suture technique:  Simple interrupted  Number of sutures:  4 Approximation:    Approximation:  Close Repair type:    Repair type:  Simple Post-procedure details:    Dressing: bandaid.   Procedure completion:  Tolerated    MEDICATIONS ORDERED IN ED: Medications  lidocaine  (PF) (XYLOCAINE ) 1 % injection 5 mL (5 mLs Infiltration Given 06/23/23 1645)  lidocaine -EPINEPHrine -tetracaine  (LET) topical gel (3 mLs Topical Given 06/23/23 1645)     IMPRESSION / MDM / ASSESSMENT AND PLAN / ED COURSE  I reviewed the triage vital signs and the nursing notes.                               53 y.o. male presents to the emergency department for evaluation and treatment of acute right hand laceration. See HPI for further details.   Differential diagnosis includes, but is not limited to laceration, tendon injury, nerve damage, fracture    Patient's presentation is most consistent with acute complicated illness / injury requiring diagnostic workup.  Patient is alert and oriented. Hemodynamically stable. Physical  exam as stated above. Xray reassuring. Patient tolerated laceration repair well. Please see procedure note. 4 sutures placed. Advised to return to ED or PCP in 7-10 days for suture removal. Short course keflex  sent to pharmacy. The patient is in stable and satisfactory condition for discharge home. ED precautions discussed. All questions and concerns were addressed during this ED visit.     FINAL CLINICAL IMPRESSION(S) / ED DIAGNOSES   Final diagnoses:  Laceration of right hand without foreign body, initial encounter     Rx / DC Orders   ED Discharge Orders          Ordered    cephALEXin  (KEFLEX ) 500 MG capsule  2 times daily        06/23/23 1726             Note:  This document was prepared using Dragon voice recognition software and may include unintentional dictation errors.    Phyllis Breeze, Mathis Cashman A, PA-C 06/23/23 1743    Viviano Ground, MD 06/28/23 [phone]

## 2023-06-23 NOTE — Telephone Encounter (Signed)
 Patient advised. Reports he is currently at ED and was advised earlier he should be seen.

## 2023-06-23 NOTE — Telephone Encounter (Signed)
  Chief Complaint: cut  Symptoms: cut to finger Frequency: x now Pertinent Negatives: Patient denies bleeding Disposition: [] ED /[] Urgent Care (no appt availability in office) / [] Appointment(In office/virtual)/ []  [place]/ [] Home Care/ [] Refused Recommended Disposition /[] [facility] [place] [facility]/ []  Follow-up with PCP Additional Notes: pt states that he cut index finger on his right hand about 15 minutes ago. States he was able to stop the bleeding. States cut is pretty deep and about an 1in deep and pt states that he can see the tendon.   Copied from CRM [id number]. Topic: Clinical - Red Word Triage >> Jun 23, 2023  2:55 PM Sasha H wrote: Red Word that prompted transfer to Nurse Triage: Pt cut his finger and is wanting stitches, says it is very deep. Reason for Disposition  Skin is split open or gaping (or length > 1/2 inch or 12 mm on the skin, 1/4 inch or 6 mm on the face)  Protocols used: Cuts and Lacerations-A-AH

## 2023-06-28 ENCOUNTER — Telehealth: Admitting: Nurse Practitioner

## 2023-06-28 ENCOUNTER — Other Ambulatory Visit: Payer: Self-pay

## 2023-06-28 ENCOUNTER — Telehealth: Payer: Self-pay

## 2023-06-28 ENCOUNTER — Ambulatory Visit
Admission: EM | Admit: 2023-06-28 | Discharge: 2023-06-28 | Disposition: A | Discharge status: Home / Self Care | Attending: Emergency Medicine | Admitting: Emergency Medicine

## 2023-06-28 DIAGNOSIS — H9201 Otalgia, right ear: Secondary | ICD-10-CM

## 2023-06-28 HISTORY — DX: Essential (primary) hypertension: I10

## 2023-06-28 MED ORDER — PREDNISONE 10 MG PO TABS
ORAL_TABLET | ORAL | 0 refills | Status: AC
  Filled 2023-06-28: qty 21, 6d supply, fill #0

## 2023-06-28 MED ORDER — PREDNISONE 10 MG (21) PO TBPK: ORAL_TABLET | Freq: Every day | ORAL | 0 refills | Status: DC

## 2023-06-28 NOTE — ED Provider Notes (Signed)
 Isaac White    CSN: [id number] Arrival date & time: 06/28/23  1203      History   Chief Complaint Chief Complaint  Patient presents with   Otalgia    HPI Isaac White is a 53 y.o. male.   Presents for evaluation of right-sided ear pain beginning 7 days ago.  Symptoms exacerbated when biting or clenching of the teeth, feels as if he is unable to fully do so.  Has attempted to clean ears with alcohol and vinegar, unsuccessful.  Denies decreased hearing, drainage, cough congestion, sore throat or fever.  Past Medical History:  Diagnosis Date   Hypertension     Patient Active Problem List   Diagnosis Date Noted   Primary hypertension 09/06/2020   Hypogonadism in male 12/27/2018    Past Surgical History:  Procedure Laterality Date   CATARACT EXTRACTION W/PHACO Right 04/23/2020   Procedure: CATARACT EXTRACTION PHACO AND INTRAOCULAR LENS PLACEMENT (IOC) RIGHT EYHANCE 2.78 00:22.4;  Surgeon: Clair Crews, MD;  Location: [facility];  Service: Ophthalmology;  Laterality: Right;   CATARACT EXTRACTION W/PHACO Left 05/07/2020   Procedure: CATARACT EXTRACTION PHACO AND INTRAOCULAR LENS PLACEMENT (IOC) LEFT EYHANCE TORIC, MALYUGIN 2.28 00:29.8;  Surgeon: Clair Crews, MD;  Location: [facility];  Service: Ophthalmology;  Laterality: Left;   COLONOSCOPY WITH PROPOFOL  N/A 10/13/2019   Procedure: COLONOSCOPY WITH PROPOFOL ;  Surgeon: Luke Salaam, MD;  Location: [facility];  Service: Gastroenterology;  Laterality: N/A;   RETINAL DETACHMENT SURGERY         Home Medications    Prior to Admission medications   Medication Sig Start Date End Date Taking? Authorizing Provider  hydrochlorothiazide  (HYDRODIURIL ) 25 MG tablet Take 1 tablet (25 mg total) by mouth daily. 06/14/23  Yes Lamon Pillow, MD  hydrOXYzine  (ATARAX ) 10 MG tablet Take 1 tablet (10 mg total) by mouth 3 (three) times daily as needed. 04/20/23  Yes Lamon Pillow, MD  rosuvastatin   (CRESTOR ) 10 MG tablet Take 1 tablet (10 mg total) by mouth daily. 03/12/23  Yes Lamon Pillow, MD  valsartan  (DIOVAN ) 160 MG tablet Take 1 tablet (160 mg total) by mouth daily. 03/12/23  Yes Lamon Pillow, MD  cephALEXin  (KEFLEX ) 500 MG capsule Take 1 capsule (500 mg total) by mouth 2 (two) times daily for 7 days. 06/23/23 06/30/23  Phyllis Breeze, Myah A, PA-C  ipratropium (ATROVENT ) 0.06 % nasal spray Place 2 sprays into both nostrils 2 (two) times daily. 03/01/23   Lamon Pillow, MD  predniSONE (DELTASONE) 10 MG tablet Take 6 tablets (60 mg total) by mouth daily for 1 day, THEN 5 tablets (50 mg total) daily for 1 day, THEN 4 tablets (40 mg total) daily for 1 day, THEN 3 tablets (30 mg total) daily for 1 day, THEN 2 tablets (20 mg total) daily for 1 day, THEN 1 tablet (10 mg total) daily for 1 day. 06/28/23 07/04/23  Reena Canning, NP  testosterone  cypionate (DEPOTESTOTERONE CYPIONATE) 100 MG/ML injection Inject 2 mLs (200 mg total) into the muscle every 14 (fourteen) days. For IM use only 03/04/23   Fisher, Erlinda Haws, MD    Family History Family History  Problem Relation Age of Onset   Hyperlipidemia Father    Hypertension Father    Heart failure Father    COPD Father    Kidney disease Father    Atrial fibrillation Father     Social History Social History   Tobacco Use   Smoking status: Never  Smokeless tobacco: Never  Vaping Use   Vaping status: Never Used  Substance Use Topics   Alcohol use: Yes    Comment: occasionally   Drug use: Never     Allergies   Patient has no known allergies.   Review of Systems Review of Systems   Physical Exam Triage Vital Signs ED Triage Vitals [06/28/23 1220]  Encounter Vitals Group     BP      Systolic BP Percentile      Diastolic BP Percentile      Pulse      Resp      Temp      Temp src      SpO2      Weight      Height      Head Circumference      Peak Flow      Pain Score 0     Pain Loc      Pain Education      Exclude  from Growth Chart    No data found.  Updated Vital Signs There were no vitals taken for this visit.  Visual Acuity Right Eye Distance:   Left Eye Distance:   Bilateral Distance:    Right Eye Near:   Left Eye Near:    Bilateral Near:     Physical Exam Constitutional:      Appearance: Normal appearance.  HENT:     Right Ear: Tympanic membrane, ear canal and external ear normal.     Nose: Nose normal.     Right Sinus: No maxillary sinus tenderness or frontal sinus tenderness.     Left Sinus: No maxillary sinus tenderness or frontal sinus tenderness.     Mouth/Throat:     Comments: Has normal range of motion of the right jaw, no clicking or popping noted with opening or closing, jaw within alignment, no abnormality noted Eyes:     Extraocular Movements: Extraocular movements intact.  Pulmonary:     Effort: Pulmonary effort is normal.  Neurological:     Mental Status: He is alert and oriented to person, place, and time.      UC Treatments / Results  Labs (all labs ordered are listed, but only abnormal results are displayed) Labs Reviewed - No data to display  EKG   Radiology No results found.  Procedures Procedures (including critical care time)  Medications Ordered in UC Medications - No data to display  Initial Impression / Assessment and Plan / UC Course  I have reviewed the triage vital signs and the nursing notes.  Pertinent labs & imaging results that were available during my care of the patient were reviewed by me and considered in my medical decision making (see chart for details).  Otalgia of right ear  Number [place] noted on exam, presentation is not consistent with TMJ, discussed findings with patient, empirically placed on prednisone and recommended nonpharmacological supportive care, advised follow-up with PCP ear canals and throat symptoms do not improve Final Clinical Impressions(s) / UC Diagnoses   Final diagnoses:  Otalgia of right ear    Discharge Instructions      Your evaluated for discomfort to your ear, on exam there are no signs of infection, there is no tenderness within the sinuses and presentation is not consistent with inflammation to the mandible joint  Begin use of prednisone taper to help reduce pain, Take as directed with food, avoid use of ibuprofen during treatment but may use Tylenol   On exam the  ears is not blocked with wax therefore you do not need to do anything more than basic cleaning  May use warm compresses to the affected area 10 to 15-minute intervals  If symptoms continue to persist despite treatment may follow-up with ear nose and throat specialist, informational front page  ED Prescriptions     Medication Sig Dispense Auth. Provider   predniSONE (STERAPRED UNI-PAK 21 TAB) 10 MG (21) TBPK tablet Take by mouth daily. Take 6 tabs by mouth daily  for 1 days, then 5 tabs for 1 days, then 4 tabs for 1 days, then 3 tabs for 1 days, 2 tabs for 1 days, then 1 tab by mouth daily for 1 days 21 tablet Taraya Steward, Maybelle Spatz, NP      PDMP not reviewed this encounter.   Reena Canning, NP 06/28/23 1358

## 2023-06-28 NOTE — Progress Notes (Signed)
 Isaac White,  If you have not recently been sick with respiratory symptoms or been swimming I am not sure the pain is from an infection. Many times referred pain is felt in the ear. Since you are having more pain when you bite down it seems this could be TMJ/dental related or something in the surrounding lymph area.   Because we cannot see into your ear I would suggest being seen in person for the best evaluation of what might be causing the pain.    I feel your condition warrants further evaluation and I recommend that you be seen for a face to face visit.  Please contact your primary care physician practice to be seen. Many offices offer virtual options to be seen via video if you are not comfortable going in person to a medical facility at this time.  NOTE: You will NOT be charged for this eVisit.  If you do not have a PCP, [facility] offers a free physician referral service available at [phone]. Our trained staff has the experience, knowledge and resources to put you in touch with a physician who is right for you.    If you are having a true medical emergency please call 911.   Your e-visit answers were reviewed by a board certified advanced clinical practitioner to complete your personal care plan.  Thank you for using e-Visits.

## 2023-06-28 NOTE — Discharge Instructions (Signed)
 Your evaluated for discomfort to your ear, on exam there are no signs of infection, there is no tenderness within the sinuses and presentation is not consistent with inflammation to the mandible joint  Begin use of prednisone taper to help reduce pain, Take as directed with food, avoid use of ibuprofen during treatment but may use Tylenol   On exam the ears is not blocked with wax therefore you do not need to do anything more than basic cleaning  May use warm compresses to the affected area 10 to 15-minute intervals  If symptoms continue to persist despite treatment may follow-up with ear nose and throat specialist, informational front page

## 2023-06-28 NOTE — ED Triage Notes (Signed)
 Rt ear pain x 1 week  Hurts when biting down sends sharp pain

## 2023-07-01 DIAGNOSIS — M26621 Arthralgia of right temporomandibular joint: Secondary | ICD-10-CM | POA: Diagnosis not present

## 2023-07-01 DIAGNOSIS — H6121 Impacted cerumen, right ear: Secondary | ICD-10-CM | POA: Diagnosis not present

## 2023-07-01 DIAGNOSIS — H9201 Otalgia, right ear: Secondary | ICD-10-CM | POA: Diagnosis not present

## 2023-07-05 ENCOUNTER — Encounter: Payer: Self-pay | Admitting: Family Medicine

## 2023-07-05 ENCOUNTER — Other Ambulatory Visit: Payer: Self-pay

## 2023-07-05 DIAGNOSIS — I10 Essential (primary) hypertension: Secondary | ICD-10-CM

## 2023-07-06 ENCOUNTER — Other Ambulatory Visit: Payer: Self-pay

## 2023-07-06 MED ORDER — VALSARTAN 320 MG PO TABS
320.0000 mg | ORAL_TABLET | Freq: Every day | ORAL | 0 refills | Status: DC
  Filled 2023-07-06: qty 90, 90d supply, fill #0

## 2023-07-06 MED ORDER — MELOXICAM 15 MG PO TABS
15.0000 mg | ORAL_TABLET | Freq: Every day | ORAL | 3 refills | Status: AC
  Filled 2023-07-06 – 2024-02-25 (×2): qty 90, 90d supply, fill #0

## 2023-07-06 NOTE — Telephone Encounter (Signed)
 Please see the message below and advise if you would like to see this pt in clinic to discuss medication changes, Last lab draw was in March

## 2023-07-17 ENCOUNTER — Encounter: Payer: Self-pay | Admitting: Family Medicine

## 2023-07-17 DIAGNOSIS — E291 Testicular hypofunction: Secondary | ICD-10-CM

## 2023-07-17 MED FILL — Rosuvastatin Calcium Tab 10 MG: ORAL | 90 days supply | Qty: 90 | Fill #1 | Status: AC

## 2023-07-20 ENCOUNTER — Other Ambulatory Visit: Payer: Self-pay

## 2023-07-20 MED ORDER — TESTOSTERONE CYPIONATE 100 MG/ML IM SOLN
200.0000 mg | INTRAMUSCULAR | 0 refills | Status: DC
  Filled 2023-07-20 – 2023-07-23 (×2): qty 10, 70d supply, fill #0

## 2023-07-22 ENCOUNTER — Other Ambulatory Visit (HOSPITAL_COMMUNITY): Payer: Self-pay

## 2023-07-22 ENCOUNTER — Telehealth: Payer: Self-pay

## 2023-07-22 NOTE — Telephone Encounter (Signed)
 Pharmacy Patient Advocate Encounter   Received notification from CoverMyMeds that prior authorization for Testosterone  Cypionate 100MG /ML intramuscular solution is required/requested.   Insurance verification completed.   The patient is insured through [facility] .   Per test claim: PA required; PA submitted to above mentioned insurance via CoverMyMeds Key/confirmation #/EOC (Key: [id number])    Status is pending

## 2023-07-23 ENCOUNTER — Other Ambulatory Visit: Payer: Self-pay

## 2023-07-23 NOTE — Telephone Encounter (Signed)
 Pharmacy Patient Advocate Encounter  Received notification from [facility] that Prior Authorization for Testosterone  Cypionate 100MG /ML intramuscular solution has been APPROVED from 6.25.26 to 6.26.26. Ran test claim,  This test claim was processed through [facility]- copay amounts may vary at other pharmacies due to pharmacy/plan contracts, or as the patient moves through the different stages of their insurance plan.   PA #/Case ID/Reference #: (Key: [id number])

## 2023-07-26 ENCOUNTER — Other Ambulatory Visit (HOSPITAL_COMMUNITY): Payer: Self-pay

## 2023-09-29 ENCOUNTER — Other Ambulatory Visit: Payer: Self-pay

## 2023-09-29 ENCOUNTER — Other Ambulatory Visit: Payer: Self-pay | Admitting: Family Medicine

## 2023-09-29 DIAGNOSIS — I10 Essential (primary) hypertension: Secondary | ICD-10-CM

## 2023-09-29 MED FILL — Valsartan Tab 320 MG: ORAL | 90 days supply | Qty: 90 | Fill #0 | Status: AC

## 2023-09-30 ENCOUNTER — Other Ambulatory Visit: Payer: Self-pay

## 2023-10-12 MED FILL — Rosuvastatin Calcium Tab 10 MG: ORAL | 90 days supply | Qty: 90 | Fill #2 | Status: AC

## 2023-12-11 ENCOUNTER — Telehealth: Payer: Self-pay | Admitting: Family Medicine

## 2023-12-11 DIAGNOSIS — E291 Testicular hypofunction: Secondary | ICD-10-CM

## 2023-12-12 ENCOUNTER — Other Ambulatory Visit: Payer: Self-pay

## 2023-12-12 ENCOUNTER — Telehealth: Payer: Self-pay | Admitting: Family Medicine

## 2023-12-12 DIAGNOSIS — E7800 Pure hypercholesterolemia, unspecified: Secondary | ICD-10-CM

## 2023-12-12 DIAGNOSIS — E291 Testicular hypofunction: Secondary | ICD-10-CM

## 2023-12-12 DIAGNOSIS — Z125 Encounter for screening for malignant neoplasm of prostate: Secondary | ICD-10-CM

## 2023-12-12 DIAGNOSIS — I10 Essential (primary) hypertension: Secondary | ICD-10-CM

## 2023-12-12 NOTE — Telephone Encounter (Signed)
 Overdue for office, needs to schedule appointment before we can approved refill.

## 2023-12-13 ENCOUNTER — Other Ambulatory Visit: Payer: Self-pay

## 2023-12-14 ENCOUNTER — Other Ambulatory Visit: Payer: Self-pay

## 2023-12-14 NOTE — Telephone Encounter (Signed)
Patient is overdue for follow up office visit and labs. Needs to schedule before refill can be approved.

## 2023-12-15 ENCOUNTER — Other Ambulatory Visit: Payer: Self-pay

## 2023-12-15 MED FILL — Testosterone Cypionate IM Inj in Oil 100 MG/ML: INTRAMUSCULAR | 70 days supply | Qty: 10 | Fill #0 | Status: AC

## 2023-12-16 NOTE — Addendum Note (Signed)
 Addended by: GASPER NANCYANN BRAVO on: 12/16/2023 05:56 PM   Modules accepted: Orders

## 2023-12-16 NOTE — Telephone Encounter (Signed)
Lab orders placed.  Needs to be fasting

## 2023-12-17 NOTE — Telephone Encounter (Signed)
 Patient advised.

## 2023-12-20 ENCOUNTER — Other Ambulatory Visit: Payer: Self-pay | Admitting: Medical Genetics

## 2023-12-20 DIAGNOSIS — E291 Testicular hypofunction: Secondary | ICD-10-CM | POA: Diagnosis not present

## 2023-12-20 DIAGNOSIS — Z125 Encounter for screening for malignant neoplasm of prostate: Secondary | ICD-10-CM | POA: Diagnosis not present

## 2023-12-22 LAB — COMPREHENSIVE METABOLIC PANEL WITH GFR
ALT: 28 IU/L (ref 0–44)
AST: 23 IU/L (ref 0–40)
Albumin: 4.8 g/dL (ref 3.8–4.9)
Alkaline Phosphatase: 59 IU/L (ref 47–123)
BUN/Creatinine Ratio: 15 (ref 9–20)
BUN: 17 mg/dL (ref 6–24)
Bilirubin Total: 0.5 mg/dL (ref 0.0–1.2)
CO2: 26 mmol/L (ref 20–29)
Calcium: 9.8 mg/dL (ref 8.7–10.2)
Chloride: 100 mmol/L (ref 96–106)
Creatinine, Ser: 1.12 mg/dL (ref 0.76–1.27)
Globulin, Total: 1.9 g/dL (ref 1.5–4.5)
Glucose: 106 mg/dL — ABNORMAL HIGH (ref 70–99)
Potassium: 4.5 mmol/L (ref 3.5–5.2)
Sodium: 141 mmol/L (ref 134–144)
Total Protein: 6.7 g/dL (ref 6.0–8.5)
eGFR: 79 mL/min/1.73 (ref >59)

## 2023-12-22 LAB — LIPID PANEL
Chol/HDL Ratio: 3.2 ratio (ref 0.0–5.0)
Cholesterol, Total: 159 mg/dL (ref 100–199)
HDL: 50 mg/dL (ref >39)
LDL Chol Calc (NIH): 79 mg/dL (ref 0–99)
Triglycerides: 177 mg/dL — ABNORMAL HIGH (ref 0–149)
VLDL Cholesterol Cal: 30 mg/dL (ref 5–40)

## 2023-12-22 LAB — CBC
Hematocrit: 46.6 % (ref 37.5–51.0)
Hemoglobin: 15.7 g/dL (ref 13.0–17.7)
MCH: 31.7 pg (ref 26.6–33.0)
MCHC: 33.7 g/dL (ref 31.5–35.7)
MCV: 94 fL (ref 79–97)
Platelets: 183 x10E3/uL (ref 150–450)
RBC: 4.96 x10E6/uL (ref 4.14–5.80)
RDW: 11.7 % (ref 11.6–15.4)
WBC: 4.3 x10E3/uL (ref 3.4–10.8)

## 2023-12-22 LAB — TESTOSTERONE,FREE AND TOTAL
Testosterone, Free: 4.1 pg/mL — AB (ref 7.2–24.0)
Testosterone: 210 ng/dL — ABNORMAL LOW (ref 264–916)

## 2023-12-22 LAB — PSA TOTAL (REFLEX TO FREE): Prostate Specific Ag, Serum: 1.1 ng/mL (ref 0.0–4.0)

## 2023-12-27 ENCOUNTER — Ambulatory Visit: Admitting: Family Medicine

## 2023-12-27 ENCOUNTER — Ambulatory Visit: Payer: Self-pay | Admitting: Family Medicine

## 2023-12-27 ENCOUNTER — Encounter: Payer: Self-pay | Admitting: Family Medicine

## 2023-12-27 VITALS — BP 130/86 | HR 85 | Resp 16 | Ht 70.0 in | Wt 209.1 lb

## 2023-12-27 DIAGNOSIS — E291 Testicular hypofunction: Secondary | ICD-10-CM

## 2023-12-27 DIAGNOSIS — Z23 Encounter for immunization: Secondary | ICD-10-CM | POA: Diagnosis not present

## 2023-12-27 DIAGNOSIS — I10 Essential (primary) hypertension: Secondary | ICD-10-CM

## 2023-12-27 DIAGNOSIS — Z8249 Family history of ischemic heart disease and other diseases of the circulatory system: Secondary | ICD-10-CM

## 2023-12-27 DIAGNOSIS — Z0000 Encounter for general adult medical examination without abnormal findings: Secondary | ICD-10-CM

## 2023-12-27 DIAGNOSIS — Z0001 Encounter for general adult medical examination with abnormal findings: Secondary | ICD-10-CM | POA: Diagnosis not present

## 2023-12-27 NOTE — Patient Instructions (Signed)
 Isaac White  Please review the attached list of medications and notify my office if there are any errors.   . Please bring all of your medications to every appointment so we can make sure that our medication list is the same as yours.

## 2023-12-27 NOTE — Progress Notes (Signed)
 Established patient visit   Patient: Isaac White   DOB: 04/23/1970   53 y.o. Male  MRN: [id number] Visit Date: 12/27/2023  Today's healthcare provider: Nancyann Perry, MD   Chief Complaint  Patient presents with   Annual Exam   Immunizations    Patient is going to get Flu vaccine today. Shingles will get it in the spring. Pneumococcal -Declined    Subjective    Discussed the use of AI scribe software for clinical note transcription with the patient, who gave verbal consent to proceed.  History of Present Illness   Isaac White is a 53 year old male who presents for an annual physical exam.  He mentions recent blood work showing slightly elevated glucose levels, which he attributes to his diet the day before the test. He is unsure if these results have been reviewed.  He is on testosterone  therapy, taking 2 mL every ten days. Previously, he took 1 mL every two weeks, which was insufficient, and then increased to 2 mL every two weeks, which seemed excessive. He adjusted to 2 mL every ten days but still experiences decreased energy levels when the dose is low. He had his blood drawn on a Monday after taking the dose the previous Friday and took another dose the past Wednesday.  He is taking rosuvastatin  for cholesterol management. He is also on valsartan  for blood pressure, which was increased from 160 mg to 320 mg. He occasionally checks his blood pressure at home, averaging around 130/85 mmHg, and questions if this is high. He is also taking hydrochlorothiazide , which he feels did not initially help but continues to take.  He has stopped taking meloxicam , which he used temporarily for jaw pain, and has received his flu shot today. He plans to delay the shingles vaccine until after the holidays.  No hearing problems, ringing in the ears, stomach problems, changes in bowel habits, or stomach cramping. He has never been a smoker.     Lab Results  Component Value Date    TESTOSTERONE  210 (L) 12/20/2023   Lab Results  Component Value Date   CHOL 159 12/20/2023   HDL 50 12/20/2023   LDLCALC 79 12/20/2023   TRIG 177 (H) 12/20/2023   CHOLHDL 3.2 12/20/2023   Lab Results  Component Value Date   NA 141 12/20/2023   K 4.5 12/20/2023   CREATININE 1.12 12/20/2023   EGFR 79 12/20/2023   GLUCOSE 106 (H) 12/20/2023   Lab Results  Component Value Date   PSA1 1.1 12/20/2023   PSA1 1.0 03/11/2022   PSA1 0.7 09/06/2020      Medications: Outpatient Medications Prior to Visit  Medication Sig   hydrochlorothiazide  (HYDRODIURIL ) 25 MG tablet Take 1 tablet (25 mg total) by mouth daily.   hydrOXYzine  (ATARAX ) 10 MG tablet Take 1 tablet (10 mg total) by mouth 3 (three) times daily as needed.   meloxicam  (MOBIC ) 15 MG tablet Take 1 tablet (15 mg total) by mouth daily for jaw joint pain.   rosuvastatin  (CRESTOR ) 10 MG tablet Take 1 tablet (10 mg total) by mouth daily.   testosterone  cypionate (DEPOTESTOTERONE CYPIONATE) 100 MG/ML injection Inject 2 mLs (200 mg total) into the muscle every 14 (fourteen) days. For IM use only   valsartan  (DIOVAN ) 320 MG tablet Take 1 tablet (320 mg total) by mouth daily. PATIENT NEEDS TO SCHEDULE OFFICE VISIT FOR FOLLOW UP   No facility-administered medications prior to visit.   Review of Systems  Constitutional:  Negative for appetite change, chills and fever.  Respiratory:  Negative for chest tightness, shortness of breath and wheezing.   Cardiovascular:  Negative for chest pain and palpitations.  Gastrointestinal:  Negative for abdominal pain, nausea and vomiting.       Objective    BP 130/86 (BP Location: Left Arm, Patient Position: Sitting, Cuff Size: Normal)   Pulse 85   Resp 16   Ht 5' 10 (1.778 m)   Wt 209 lb 1.6 oz (94.8 kg)   SpO2 98%   BMI 30.00 kg/m   Physical Exam   General Appearance:    Well developed, well nourished male. Alert, cooperative, in no acute distress, appears stated age  Head:     Normocephalic, without obvious abnormality, atraumatic  Eyes:    PERRL, conjunctiva/corneas clear, EOM's intact, fundi    benign, both eyes       Ears:    Normal TM's and external ear canals, both ears  Nose:   Nares normal, septum midline, mucosa normal, no drainage   or sinus tenderness  Throat:   Lips, mucosa, and tongue normal; teeth and gums normal  Neck:   Supple, symmetrical, trachea midline, no adenopathy;       thyroid :  No enlargement/tenderness/nodules; no carotid   bruit or JVD  Back:     Symmetric, no curvature, ROM normal, no CVA tenderness  Lungs:     Clear to auscultation bilaterally, respirations unlabored  Chest wall:    No tenderness or deformity  Heart:    Normal heart rate. Normal rhythm. No murmurs, rubs, or gallops.  S1 and S2 normal  Abdomen:     Soft, non-tender, bowel sounds active all four quadrants,    no masses, no organomegaly  Genitalia:    deferred  Rectal:    deferred  Extremities:   All extremities are intact. No cyanosis or edema  Pulses:   2+ and symmetric all extremities  Skin:   Skin color, texture, turgor normal, no rashes or lesions  Lymph nodes:   Cervical, supraclavicular, and axillary nodes normal  Neurologic:   CNII-XII intact. Normal strength, sensation and reflexes      throughout     Assessment & Plan        Adult Wellness Visit Routine visit with slightly elevated glucose likely due to diet. Cholesterol well-managed. Borderline high blood pressure at 130/85 mmHg with valsartan  at maximum dose. - Continue current medications including valsartan  and hydrochlorothiazide . - Monitor blood pressure at home. - Recheck blood work in spring.  Essential hypertension Blood pressure borderline high at 130/85 mmHg. Current regimen includes valsartan  at maximum dose and hydrochlorothiazide . No side effects from hydrochlorothiazide . - Continue valsartan  and hydrochlorothiazide . - Monitor blood pressure at home.  Testicular  hypofunction Testosterone  levels slightly low on 100 mg/mL testosterone  every 10 days. Previous 200 mg/mL regimen more effective. Discussed alternative delivery methods; he prefers injections. - Switch to 200 mg/mL testosterone  every two weeks. - Recheck testosterone  levels in 4-5 months.  Immunization management Received flu shot. Shingles vaccine deferred due to potency concerns and potential side effects. Plans to return for shingles vaccine after holidays. - Administered flu shot today. - Plan to return for shingles vaccine after the holidays.    Return in about 1 year (around 12/26/2024) for Yearly Physical.     Nancyann Perry, MD  [facility] [phone] (phone) [phone] (fax)  [facility]

## 2024-01-05 ENCOUNTER — Encounter: Admitting: Family Medicine

## 2024-01-13 ENCOUNTER — Other Ambulatory Visit: Payer: Self-pay

## 2024-01-13 ENCOUNTER — Other Ambulatory Visit: Payer: Self-pay | Admitting: Family Medicine

## 2024-01-13 DIAGNOSIS — I10 Essential (primary) hypertension: Secondary | ICD-10-CM

## 2024-01-13 MED ORDER — VALSARTAN 320 MG PO TABS
320.0000 mg | ORAL_TABLET | Freq: Every day | ORAL | 0 refills | Status: AC
  Filled 2024-01-13: qty 90, 90d supply, fill #0

## 2024-01-13 MED FILL — Rosuvastatin Calcium Tab 10 MG: ORAL | 60 days supply | Qty: 60 | Fill #3 | Status: AC

## 2024-01-17 DIAGNOSIS — Z961 Presence of intraocular lens: Secondary | ICD-10-CM | POA: Diagnosis not present

## 2024-01-17 DIAGNOSIS — H33023 Retinal detachment with multiple breaks, bilateral: Secondary | ICD-10-CM | POA: Diagnosis not present

## 2024-01-17 DIAGNOSIS — B301 Conjunctivitis due to adenovirus: Secondary | ICD-10-CM | POA: Diagnosis not present

## 2024-01-17 DIAGNOSIS — H43811 Vitreous degeneration, right eye: Secondary | ICD-10-CM | POA: Diagnosis not present

## 2024-01-18 ENCOUNTER — Other Ambulatory Visit: Payer: Self-pay | Admitting: Family Medicine

## 2024-01-18 ENCOUNTER — Other Ambulatory Visit (HOSPITAL_COMMUNITY): Payer: Self-pay

## 2024-01-18 DIAGNOSIS — E291 Testicular hypofunction: Secondary | ICD-10-CM

## 2024-01-18 MED ORDER — TESTOSTERONE CYPIONATE 200 MG/ML IJ SOLN: 2.0000 mL | INTRAMUSCULAR | 1 refills | Status: DC

## 2024-01-19 ENCOUNTER — Other Ambulatory Visit (HOSPITAL_COMMUNITY): Payer: Self-pay

## 2024-01-21 ENCOUNTER — Telehealth: Payer: Self-pay | Admitting: Family Medicine

## 2024-01-24 ENCOUNTER — Other Ambulatory Visit (HOSPITAL_COMMUNITY): Payer: Self-pay

## 2024-02-16 ENCOUNTER — Encounter: Payer: Self-pay | Admitting: Family Medicine

## 2024-02-16 MED ORDER — TESTOSTERONE CYPIONATE 200 MG/ML IJ SOLN: 2.0000 mL | INTRAMUSCULAR | 5 refills | Status: DC

## 2024-02-17 ENCOUNTER — Other Ambulatory Visit (HOSPITAL_COMMUNITY): Payer: Self-pay

## 2024-02-17 ENCOUNTER — Telehealth: Payer: Self-pay | Admitting: Pharmacy Technician

## 2024-02-17 NOTE — Telephone Encounter (Signed)
 Pharmacy Patient Advocate Encounter   Received notification from [employer] that prior authorization for Testosterone  Cypionate 200MG /ML intramuscular solution is required/requested.   Insurance verification completed.   The patient is insured through [facility].   Per test claim: PA required; PA started via CoverMyMeds. KEY [id number] . Waiting for clinical questions to populate.

## 2024-02-18 ENCOUNTER — Other Ambulatory Visit (HOSPITAL_COMMUNITY): Payer: Self-pay

## 2024-02-18 NOTE — Telephone Encounter (Signed)
 Pharmacy Patient Advocate Encounter  Received notification from [facility] that Prior Authorization for Testosterone  Cypionate 200MG /ML intramuscular solution has been APPROVED from 02/17/24 to 02/15/25. Ran test claim, Copay is $56.46 for 3 months. This test claim was processed through [facility]- copay amounts may vary at other pharmacies due to pharmacy/plan contracts, or as the patient moves through the different stages of their insurance plan.   PA #/Case ID/Reference #: [id number]

## 2024-02-21 ENCOUNTER — Other Ambulatory Visit (HOSPITAL_COMMUNITY): Payer: Self-pay

## 2024-02-22 ENCOUNTER — Other Ambulatory Visit: Payer: Self-pay

## 2024-02-22 DIAGNOSIS — E291 Testicular hypofunction: Secondary | ICD-10-CM

## 2024-02-22 MED ORDER — TESTOSTERONE CYPIONATE 200 MG/ML IM SOLN
400.0000 mg | INTRAMUSCULAR | 5 refills | Status: AC
  Filled 2024-02-22: qty 12, 84d supply, fill #0
  Filled 2024-02-25: qty 15, 98d supply, fill #0
  Filled 2024-02-25: qty 12, 84d supply, fill #0

## 2024-02-23 ENCOUNTER — Other Ambulatory Visit: Payer: Self-pay

## 2024-02-24 ENCOUNTER — Other Ambulatory Visit: Payer: Self-pay

## 2024-02-25 ENCOUNTER — Other Ambulatory Visit: Payer: Self-pay

## 2024-02-25 ENCOUNTER — Other Ambulatory Visit (HOSPITAL_COMMUNITY): Payer: Self-pay

## 2024-02-25 DIAGNOSIS — E291 Testicular hypofunction: Secondary | ICD-10-CM

## 2024-02-25 NOTE — Telephone Encounter (Signed)
 Spoke with [facility] and cancelled rx. Medication was still in store and was not picked up. I will contact [facility] to proceed with filling medication.

## 2024-02-25 NOTE — Telephone Encounter (Signed)
 Dr Gasper,  I will call around 9 to cancel rx at [facility], Pt testosterone  [facility] and needs to go to [facility].

## 2024-02-26 ENCOUNTER — Other Ambulatory Visit (HOSPITAL_COMMUNITY): Payer: Self-pay
